# Patient Record
Sex: Female | Born: 1945 | Race: Black or African American | Hispanic: No | Marital: Single | Smoking: Never smoker
Health system: Southern US, Community
[De-identification: ages and names within clinical notes are randomized; demographics above are authoritative.]

## PROBLEM LIST (undated history)

## (undated) DIAGNOSIS — I639 Cerebral infarction, unspecified: Secondary | ICD-10-CM

## (undated) DIAGNOSIS — I10 Essential (primary) hypertension: Secondary | ICD-10-CM

## (undated) DIAGNOSIS — E785 Hyperlipidemia, unspecified: Secondary | ICD-10-CM

## (undated) HISTORY — DX: Essential (primary) hypertension: I10

## (undated) HISTORY — DX: Hyperlipidemia, unspecified: E78.5

## (undated) HISTORY — DX: Cerebral infarction, unspecified: I63.9

---

## 2011-02-27 DIAGNOSIS — I639 Cerebral infarction, unspecified: Secondary | ICD-10-CM

## 2011-02-27 HISTORY — DX: Cerebral infarction, unspecified: I63.9

## 2016-12-07 ENCOUNTER — Ambulatory Visit (HOSPITAL_COMMUNITY)
Admission: EM | Admit: 2016-12-07 | Discharge: 2016-12-07 | Discharge status: Absent without leave | Payer: Medicare HMO | Attending: Family Medicine | Admitting: Family Medicine

## 2017-10-25 ENCOUNTER — Ambulatory Visit: Payer: Medicare HMO | Admitting: Family Medicine

## 2017-11-11 ENCOUNTER — Encounter: Payer: Self-pay | Admitting: Family Medicine

## 2017-11-11 ENCOUNTER — Other Ambulatory Visit: Payer: Self-pay | Admitting: Family Medicine

## 2017-11-11 ENCOUNTER — Ambulatory Visit (INDEPENDENT_AMBULATORY_CARE_PROVIDER_SITE_OTHER): Payer: Medicare HMO | Admitting: Family Medicine

## 2017-11-11 ENCOUNTER — Other Ambulatory Visit: Payer: Self-pay

## 2017-11-11 VITALS — BP 120/68 | HR 71 | Temp 97.5°F | Ht 63.0 in | Wt 207.0 lb

## 2017-11-11 DIAGNOSIS — R2689 Other abnormalities of gait and mobility: Secondary | ICD-10-CM

## 2017-11-11 DIAGNOSIS — Z0000 Encounter for general adult medical examination without abnormal findings: Secondary | ICD-10-CM | POA: Diagnosis not present

## 2017-11-11 DIAGNOSIS — I10 Essential (primary) hypertension: Secondary | ICD-10-CM | POA: Diagnosis not present

## 2017-11-11 DIAGNOSIS — E559 Vitamin D deficiency, unspecified: Secondary | ICD-10-CM

## 2017-11-11 DIAGNOSIS — R6889 Other general symptoms and signs: Secondary | ICD-10-CM | POA: Diagnosis not present

## 2017-11-11 DIAGNOSIS — D509 Iron deficiency anemia, unspecified: Secondary | ICD-10-CM | POA: Diagnosis not present

## 2017-11-11 DIAGNOSIS — Z23 Encounter for immunization: Secondary | ICD-10-CM

## 2017-11-11 DIAGNOSIS — E7800 Pure hypercholesterolemia, unspecified: Secondary | ICD-10-CM | POA: Insufficient documentation

## 2017-11-11 MED ORDER — AMLODIPINE BESYLATE 10 MG PO TABS: 10.0000 mg | ORAL_TABLET | Freq: Every day | ORAL | 0 refills | Status: DC

## 2017-11-11 MED ORDER — CLONIDINE HCL 0.1 MG PO TABS: 0.1000 mg | ORAL_TABLET | Freq: Two times a day (BID) | ORAL | 3 refills | Status: DC

## 2017-11-11 MED ORDER — ASPIRIN EC 325 MG PO TBEC: 325.0000 mg | DELAYED_RELEASE_TABLET | Freq: Every day | ORAL | 0 refills | Status: DC

## 2017-11-11 MED ORDER — CLONIDINE HCL 0.1 MG PO TABS: 0.1000 mg | ORAL_TABLET | Freq: Two times a day (BID) | ORAL | 0 refills | Status: DC

## 2017-11-11 MED ORDER — ASPIRIN EC 325 MG PO TBEC: 325.0000 mg | DELAYED_RELEASE_TABLET | Freq: Every day | ORAL | 3 refills | Status: DC

## 2017-11-11 MED ORDER — SIMVASTATIN 20 MG PO TABS: 20.0000 mg | ORAL_TABLET | Freq: Every day | ORAL | 3 refills | Status: DC

## 2017-11-11 MED ORDER — ENALAPRIL MALEATE 10 MG PO TABS: 10.0000 mg | ORAL_TABLET | Freq: Every day | ORAL | 3 refills | Status: DC

## 2017-11-11 MED ORDER — AMLODIPINE BESYLATE 10 MG PO TABS: 10.0000 mg | ORAL_TABLET | Freq: Every day | ORAL | 3 refills | Status: DC

## 2017-11-11 MED ORDER — ENALAPRIL MALEATE 10 MG PO TABS: 10.0000 mg | ORAL_TABLET | Freq: Every day | ORAL | 0 refills | Status: DC

## 2017-11-11 NOTE — Assessment & Plan Note (Signed)
Will obtain lipid panel today.  Refilled patient's simvastatin.

## 2017-11-11 NOTE — Assessment & Plan Note (Signed)
Will check TSH today 

## 2017-11-11 NOTE — Assessment & Plan Note (Signed)
Patient is taking iron, but she does not know whether she has iron deficiency anemia or why she would have a iron deficiency anemia.  We will obtain a CBC and ferritin level today and stop iron if it is not necessary.

## 2017-11-11 NOTE — Assessment & Plan Note (Signed)
Will obtain vitamin-D level today

## 2017-11-11 NOTE — Assessment & Plan Note (Signed)
Discussed importance of mammogram and colonoscopy.  Patient would like to continue discussing this at her next visit.

## 2017-11-11 NOTE — Progress Notes (Signed)
Subjective:    Wanda Vaughn - 72 y.o. female MRN [id number]  Date of birth: [birth year]  HPI  Wanda Vaughn is here to establish care.  She would also like to discuss her balance issues and is concerned that one of the glands in her neck is swollen.  Balance issues: Her balance has been reduced since her stroke in 2013.  She walks with a cane for some of the time, although she finds the cane cumbersome at times.  She feels like her fear of falling also contributes to her hesitation while walking.  She often feels woozy when going from sitting to standing.  She denies any falls.  Swollen glands in neck: Says that the glands at the base of her anterior neck are sometimes swollen but are not swollen currently.  She endorses some cold intolerance but denies weight changes or hair/skin changes.   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  No cigarette smoking or alcohol use. Lives with daughter and grandson.   Health Maintenance:  Is due for mammogram and colonoscopy, but would like to discuss these at a later date.  Would like flu shot today. Health Maintenance Due  Topic Date Due  . Hepatitis C Screening  1945/12/10  . TETANUS/TDAP  11/24/1964  . MAMMOGRAM  11/25/1995  . COLONOSCOPY  11/25/1995  . DEXA SCAN  11/25/2010  . PNA vac Low Risk Adult (1 of 2 - PCV13) 11/25/2010    -  reports that she has never smoked. She has never used smokeless tobacco. - Review of Systems: Per HPI. - Past Medical History: Patient Active Problem List   Diagnosis Date Noted  . Pure hypercholesterolemia 11/11/2017  . Cold intolerance  11/11/2017  . Iron deficiency anemia 11/11/2017  . Vitamin D deficiency 11/11/2017  . Essential hypertension 11/11/2017  . Impairment of balance 11/11/2017  . Healthcare maintenance 11/11/2017   - Medications: reviewed and updated   Objective:   Physical Exam BP 120/68   Pulse 71   Temp (!) 97.5 F (36.4 C) (Oral)   Ht 5\' 3"  (1.6 m)   Wt 207 lb (93.9 kg)   SpO2 99%   BMI 36.67 kg/m  Gen: NAD, alert, cooperative with exam, well-appearing, pleasant, obese HEENT: NCAT, PERRL, clear conjunctiva, oropharynx clear, supple neck, no thyromegaly CV: RRR, good S1/S2, no murmur, 1+ pitting edema bilaterally  Resp: CTABL, no wheezes, non-labored Abd: SNTND, BS present, no guarding or organomegaly Skin: no rashes, normal turgor  Neuro: no gross deficits; takes small steps while walking and ambulates slowly Psych: good insight, alert and oriented        Assessment & Plan:   Cold intolerance Will check TSH today.  Impairment of balance Likely sequela of 2013 stroke.  Does not appear to be due to vertigo.  May also be due in part to orthostatic hypotension given patient's worsening symptoms when going from sitting to standing and regimen of 3 blood pressure medicines.  Advised patient to slowly rise from a seated position to reduce orthostatic symptoms.  May benefit from decreasing blood pressure regimen in the future, although we will decide  if those medications need changing at her next visit.  Essential hypertension Will refill patient's amlodipine, enalapril, and clonidine today.  Warned patient that she can have rebound hypertension with stopping clonidine cold [place], so she will need to notify us ahead of time to refill this medication.  Patient's blood pressure was normal today even with being out of enalapril and amlodipine for the past week.  We may be able to increase or stop her clonidine depending on her blood pressure at her next visit.  Will obtain BMP today.  Pure  hypercholesterolemia Will obtain lipid panel today.  Refilled patient's simvastatin.  Iron deficiency anemia Patient is taking iron, but she does not know whether she has iron deficiency anemia or why she would have a iron deficiency anemia.  We will obtain a CBC and ferritin level today and stop iron if it is not necessary.  Vitamin D deficiency Will obtain vitamin D level today.  Healthcare maintenance Discussed importance of mammogram and colonoscopy.  Patient would like to continue discussing this at her next visit.    Maia Breslow, M.D. 11/11/2017, 9:31 AM PGY-2, [place]

## 2017-11-11 NOTE — Assessment & Plan Note (Signed)
Will refill patient's amlodipine, enalapril, and clonidine today.  Warned patient that she can have rebound hypertension with stopping clonidine cold [place], so she will need to notify us ahead of time to refill this medication.  Patient's blood pressure was normal today even with being out of enalapril and amlodipine for the past week.  We may be able to increase or stop her clonidine depending on her blood pressure at her next visit.  Will obtain BMP today.

## 2017-11-11 NOTE — Patient Instructions (Addendum)
It was nice meeting you today Wanda Vaughn!  Today, we will get several labs, and I will contact you in the next few days with the results.  I am refilling your medications and sending them to the [facility] on [address].  I will also send them to the [facility] mail order pharmacy.  Please change how you're taking enalapril from twice daily to once daily.  I would like to see you back in two months.  If you have any questions or concerns, please feel free to call the clinic.   Be well,  Dr. Shan Levans

## 2017-11-11 NOTE — Addendum Note (Signed)
Addended by: Kathrene Alu on: 11/11/2017 09:38 AM   Modules accepted: Orders

## 2017-11-11 NOTE — Assessment & Plan Note (Addendum)
Likely sequela of 2013 stroke.  Does not appear to be due to vertigo.  May also be due in part to orthostatic hypotension given patient's worsening symptoms when going from sitting to standing and regimen of 3 blood pressure medicines.  Advised patient to slowly rise from a seated position to reduce orthostatic symptoms.  May benefit from decreasing blood pressure regimen in the future, although we will decide if those medications need changing at her next visit.

## 2017-11-12 ENCOUNTER — Telehealth: Payer: Self-pay | Admitting: Family Medicine

## 2017-11-12 LAB — CBC
HEMATOCRIT: 34.2 % (ref 34.0–46.6)
HEMOGLOBIN: 10.9 g/dL — AB (ref 11.1–15.9)
MCH: 28.9 pg (ref 26.6–33.0)
MCHC: 31.9 g/dL (ref 31.5–35.7)
MCV: 91 fL (ref 79–97)
PLATELETS: 248 10*3/uL (ref 150–450)
RBC: 3.77 x10E6/uL (ref 3.77–5.28)
RDW: 16.2 % — ABNORMAL HIGH (ref 12.3–15.4)
WBC: 7.1 10*3/uL (ref 3.4–10.8)

## 2017-11-12 LAB — BASIC METABOLIC PANEL
BUN/Creatinine Ratio: 16 (ref 12–28)
BUN: 16 mg/dL (ref 8–27)
CALCIUM: 9.9 mg/dL (ref 8.7–10.3)
CO2: 22 mmol/L (ref 20–29)
Chloride: 110 mmol/L — ABNORMAL HIGH (ref 96–106)
Creatinine, Ser: 1.02 mg/dL — ABNORMAL HIGH (ref 0.57–1.00)
GFR calc Af Amer: 64 mL/min/{1.73_m2} (ref >59)
GFR, EST NON AFRICAN AMERICAN: 55 mL/min/{1.73_m2} — AB (ref >59)
GLUCOSE: 92 mg/dL (ref 65–99)
Potassium: 4.5 mmol/L (ref 3.5–5.2)
SODIUM: 143 mmol/L (ref 134–144)

## 2017-11-12 LAB — LIPID PANEL
CHOLESTEROL TOTAL: 161 mg/dL (ref 100–199)
Chol/HDL Ratio: 2.7 ratio (ref 0.0–4.4)
HDL: 60 mg/dL (ref >39)
LDL Calculated: 88 mg/dL (ref 0–99)
Triglycerides: 65 mg/dL (ref 0–149)
VLDL CHOLESTEROL CAL: 13 mg/dL (ref 5–40)

## 2017-11-12 LAB — FERRITIN: Ferritin: 113 ng/mL (ref 15–150)

## 2017-11-12 LAB — VITAMIN D 25 HYDROXY (VIT D DEFICIENCY, FRACTURES): Vit D, 25-Hydroxy: 25.7 ng/mL — ABNORMAL LOW (ref 30.0–100.0)

## 2017-11-12 LAB — TSH: TSH: 1.59 u[IU]/mL (ref 0.450–4.500)

## 2017-11-12 NOTE — Telephone Encounter (Signed)
Called patient to inform her that her TSH, cholesterol, CBC, BMP, and ferritin levels were normal.  Even though her Vitamin D is low, I told her that there is no good evidence that taking a vitamin D supplement would be helpful for her, so she can stop taking her vitamin D.  Also, since her ferritin level was normal, she can stop taking iron.  She was in agreement with this plan.

## 2017-12-23 ENCOUNTER — Ambulatory Visit: Payer: Medicare HMO | Admitting: Family Medicine

## 2018-01-31 ENCOUNTER — Other Ambulatory Visit: Payer: Self-pay

## 2018-01-31 ENCOUNTER — Encounter: Payer: Self-pay | Admitting: Family Medicine

## 2018-01-31 ENCOUNTER — Ambulatory Visit (INDEPENDENT_AMBULATORY_CARE_PROVIDER_SITE_OTHER): Payer: Medicare HMO | Admitting: Family Medicine

## 2018-01-31 VITALS — BP 158/76 | HR 93 | Temp 98.8°F | Ht 63.0 in | Wt 208.4 lb

## 2018-01-31 DIAGNOSIS — I10 Essential (primary) hypertension: Secondary | ICD-10-CM

## 2018-01-31 DIAGNOSIS — R2689 Other abnormalities of gait and mobility: Secondary | ICD-10-CM | POA: Diagnosis not present

## 2018-01-31 DIAGNOSIS — Z0000 Encounter for general adult medical examination without abnormal findings: Secondary | ICD-10-CM

## 2018-01-31 NOTE — Assessment & Plan Note (Addendum)
Unsure what the cause of this is.  Could be a sequelae of her 2013 stroke, but her symptoms started 2 years after her stroke occurred.  It is possible that she has microvascular disease causing this symptom as well.  Also possible to be BPPV.  Does not seem to be in eyesight issue because she was seen by her eye doctor 1 year ago without any problems noted.  Dix-Hallpike maneuver was negative.  Orthostatic vital signs are negative.  will refer to physical therapy to see if they can give her modifications to improve her balance and increase her strength and endurance.

## 2018-01-31 NOTE — Assessment & Plan Note (Signed)
Patient's blood pressure was elevated today but she says that she did not take her morning medications because she was in a rush to get to her appointment.  Her blood pressure usually runs in the 120s/60s-70s, so we will continue her current regimen.

## 2018-01-31 NOTE — Assessment & Plan Note (Signed)
Order was placed for a screening mammogram.  Patient will call to schedule her mammogram when it is convenient for her.  She is unsure about what colon cancer screening she should do.  I explained the different options and said that they are all optional for her.  She will continue to consider them.

## 2018-01-31 NOTE — Patient Instructions (Addendum)
It was nice seeing you today Ms. Popp!  Your labs looked great!  Keep up the good work.  For your imbalance, I am referring you to physical therapy to see if they can help with this issue.  You should get a call about scheduling this in a few weeks.  I am placing the order for your mammogram - please call the mammogram office when you're ready to schedule it.  Keep thinking about whether you would want a colonoscopy or the cologuard cards.  We will mail your SCAT application to you.  I'd like to see you again in six months, or sooner if you need.  If you have any questions or concerns, please feel free to call the clinic.   Be well,  Dr. Shan Levans

## 2018-01-31 NOTE — Progress Notes (Signed)
Subjective:    Wanda Vaughn - 72 y.o. female MRN [id number]  Date of birth: [birth year]  CC SCAT application, walking difficulties  HPI  Wanda Vaughn is here to follow up on the above issues.  Imbalance while walking - she thinks it may be due to her eyesight, but she says it is hard to describe - has not fallen since her symptoms started four years ago - does not feel like dizziness, lightheadedness, or vertigo - had a stroke in 2013 but symptoms started around 2015  SCAT  - has difficulties climbing and walking long distances  - often uses a cane during ambulation  Health Maintenance:  - will schedule a mammogram when she has her SCAT arranged - is considering colonoscopy vs cologuard Health Maintenance Due  Topic Date Due  . Hepatitis C Screening  1945/07/15  . TETANUS/TDAP  11/24/1964  . MAMMOGRAM  11/25/1995  . COLONOSCOPY  11/25/1995  . DEXA SCAN  11/25/2010  . PNA vac Low Risk Adult (1 of 2 - PCV13) 11/25/2010    -  reports that she has never smoked. She has never used smokeless tobacco. - Review of Systems: Per HPI. - Past Medical History: Patient Active Problem List   Diagnosis Date Noted  . Pure hypercholesterolemia 11/11/2017  . Cold intolerance 11/11/2017  . Iron deficiency anemia 11/11/2017  . Vitamin D deficiency 11/11/2017  . Essential hypertension 11/11/2017  . Impairment of balance 11/11/2017  . Healthcare maintenance 11/11/2017   - Medications: reviewed and updated   Objective:   Physical Exam BP (!) 158/76   Pulse 93   Temp 98.8 F (37.1 C) (Oral)   Ht 5\' 3"  (1.6 m)   Wt 208 lb 6.4 oz (94.5 kg)   SpO2 97%   BMI 36.92 kg/m  Gen: NAD, alert, cooperative with exam, well-appearing, pleasant CV: RRR, good S1/S2, no murmur Resp: CTABL, no wheezes, non-labored Neuro: no gross deficits.  Psych: good insight, alert and oriented   Orthostatic vital signs:  Orthostatic VS for the past 24 hrs:  BP- Sitting Pulse- Sitting BP- Standing at  0 minutes Pulse- Standing at 0 minutes  01/31/18 0900 158/80 83 162/86 95         Assessment & Plan:   Impairment of balance Unsure what the cause of this is.  Could be a sequelae of her 2013 stroke, but her symptoms started 2 years after her stroke occurred.  It is possible that she has microvascular disease causing this symptom as well.  Also possible to be BPPV.  Does not seem to be in eyesight issue because she was seen by her eye doctor 1 year ago without any problems noted.  Dix-Hallpike maneuver was negative.  Orthostatic vital signs are negative.  will refer to physical therapy to see if they can give her modifications to improve her balance and increase her strength and endurance.  Essential hypertension Patient's blood pressure was elevated today but she says that she did not take her morning medications because she was in a rush to get to her appointment.  Her blood pressure usually runs in the 120s/60s-70s, so we will continue her current regimen.  Healthcare maintenance Order was placed for a screening mammogram.  Patient will call to schedule her mammogram when it is convenient for her.  She is unsure about what colon cancer screening she should do.  I explained the different options and said that they are all optional for her.  She will continue to  consider them.    Maia Breslow, M.D. 01/31/2018, 10:46 AM PGY-2, [place]

## 2018-02-07 ENCOUNTER — Other Ambulatory Visit: Payer: Self-pay | Admitting: Family Medicine

## 2018-02-21 ENCOUNTER — Telehealth: Payer: Self-pay | Admitting: Family Medicine

## 2018-02-21 NOTE — Telephone Encounter (Signed)
Patient dropped off form to be completed for Dr Shan Levans.  She would like it mailed to her home after filled out and signed.

## 2018-03-05 NOTE — Telephone Encounter (Signed)
Form was completed and mailed at the end of December.  Thanks!

## 2018-08-21 ENCOUNTER — Other Ambulatory Visit: Payer: Self-pay | Admitting: Family Medicine

## 2018-09-02 ENCOUNTER — Encounter: Payer: Self-pay | Admitting: Family Medicine

## 2018-09-02 ENCOUNTER — Ambulatory Visit (INDEPENDENT_AMBULATORY_CARE_PROVIDER_SITE_OTHER): Payer: Medicare HMO | Admitting: Family Medicine

## 2018-09-02 ENCOUNTER — Other Ambulatory Visit: Payer: Self-pay

## 2018-09-02 VITALS — BP 162/80 | HR 73 | Wt 211.2 lb

## 2018-09-02 DIAGNOSIS — Z23 Encounter for immunization: Secondary | ICD-10-CM | POA: Diagnosis not present

## 2018-09-02 DIAGNOSIS — R2689 Other abnormalities of gait and mobility: Secondary | ICD-10-CM

## 2018-09-02 DIAGNOSIS — Z1211 Encounter for screening for malignant neoplasm of colon: Secondary | ICD-10-CM

## 2018-09-02 DIAGNOSIS — I10 Essential (primary) hypertension: Secondary | ICD-10-CM

## 2018-09-02 DIAGNOSIS — E2839 Other primary ovarian failure: Secondary | ICD-10-CM | POA: Diagnosis not present

## 2018-09-02 MED ORDER — ENALAPRIL MALEATE 20 MG PO TABS: 20.0000 mg | ORAL_TABLET | Freq: Every day | ORAL | 3 refills | Status: DC

## 2018-09-02 MED ORDER — ASPIRIN 81 MG PO TBEC: 81.0000 mg | DELAYED_RELEASE_TABLET | Freq: Every day | ORAL | 0 refills | Status: DC

## 2018-09-02 NOTE — Assessment & Plan Note (Signed)
Patient's blood pressure was greater than 160/80 on two measurements.  We will wean off of her clonidine.  Patient was instructed to take this every other night for the next week and then stop.  We will increase her dose of enalapril to 20 mg nightly and continue her amlodipine.  She should come in in 1 month for a nurse visit to recheck her blood pressure.

## 2018-09-02 NOTE — Assessment & Plan Note (Signed)
Home health PT was ordered today since patient may benefit from training to increase her sense of balance while ambulating.  This does not seem to be vertigo or orthostatic hypotension due to patient's description detailed above.

## 2018-09-02 NOTE — Patient Instructions (Signed)
It was nice seeing you today Wanda Vaughn!  For your blood pressure, please take the clonidine every other night for a week and then stop it.  We are increasing your enalapril to 20 mg, so when the new prescription arrives, you can continue taking 1 pill per night.  Until then, you can take 2 pills of your current prescription.  I would like for you to return in about 1 month for [place] to recheck your blood pressure.  You can make an nurse appointment which will be quicker than seeing me.  I am prescribing a mammogram, a bone density scan, and Cologuard.  Please call [facility] to schedule your mammogram and bone density scan.  You will receive a call from the [facility] telling you how to do this screen.  I am also ordering physical therapy to help with your balance.  Please let me know if you have not heard from anyone in the next 2 weeks.  If you have any questions or concerns, please feel free to call the clinic.   Be well,  Dr. Shan Levans

## 2018-09-02 NOTE — Progress Notes (Signed)
   Subjective:    Wanda Vaughn - 73 y.o. female MRN [id number]  Date of birth: 1946-02-02  CC:  NOELA BROTHERS is here for follow up of imbalance with ambulation and health maintenance.  HPI: Impairment of balance Patient says that she will feel a sense of imbalance when ambulating.  She has a cane but tries not to use it.  She does not feel like the room is spinning and does not feel any nausea.  She also does not feel lightheaded, and this does not occur when going from sitting to standing.  This has been an ongoing issue for over a year.  She does have a history of a CVA in 2013 and is not sure if this is related.  She notes that she went to get some pants tailored and the tailor told her that one leg was significantly shorter than the other.  Hypertension medications Patient reports that she has stopped taking her clonidine during the day even though she it supposed to be a twice daily medication.  She now only takes clonidine 0.1 mg nightly along with her amlodipine and enalapril for blood pressure.  She stopped taking the clonidine during the day because it made her feel more fatigued.  Health Maintenance:  PCV 13 was given, and mammogram, Cologuard, and DEXA scan were ordered. Health Maintenance Due  Topic Date Due  . Hepatitis C Screening  1945/09/24  . TETANUS/TDAP  11/24/1964  . MAMMOGRAM  11/25/1995  . DEXA SCAN  11/25/2010    -  reports that she has never smoked. She has never used smokeless tobacco. - Review of Systems: Per HPI. - Past Medical History: Patient Active Problem List   Diagnosis Date Noted  . Pure hypercholesterolemia 11/11/2017  . Cold intolerance 11/11/2017  . Iron deficiency anemia 11/11/2017  . Vitamin D deficiency 11/11/2017  . Essential hypertension 11/11/2017  . Impairment of balance 11/11/2017  . Healthcare maintenance 11/11/2017   - Medications: reviewed and updated   Objective:   Physical Exam BP (!) 162/80   Pulse 73   Wt 211 lb 3.2 oz  (95.8 kg)   SpO2 99%   BMI 37.41 kg/m  Gen: NAD, alert, cooperative with exam, well-appearing, pleasant HEENT: NCAT, clear conjunctiva, supple neck CV: RRR, good S1/S2, no murmur, no edema Resp: CTABL, no wheezes, non-labored Abd: SNTND, BS present, no guarding or organomegaly Skin: no rashes, normal turgor  Psych: good insight, alert and oriented Musculoskeletal: Normal gait, no Trendelenburg sign, no leg length disparity on gross inspection      Assessment & Plan:   Impairment of balance Home health PT was ordered today since patient may benefit from training to increase her sense of balance while ambulating.  This does not seem to be vertigo or orthostatic hypotension due to patient's description detailed above.  Essential hypertension Patient's blood pressure was greater than 160/80 on two measurements.  We will wean off of her clonidine.  Patient was instructed to take this every other night for the next week and then stop.  We will increase her dose of enalapril to 20 mg nightly and continue her amlodipine.  She should come in in 1 month for a nurse visit to recheck her blood pressure.    Maia Breslow, M.D. 09/02/2018, 1:46 PM PGY-3, [place]

## 2018-09-10 DIAGNOSIS — Z8673 Personal history of transient ischemic attack (TIA), and cerebral infarction without residual deficits: Secondary | ICD-10-CM | POA: Diagnosis not present

## 2018-09-10 DIAGNOSIS — E559 Vitamin D deficiency, unspecified: Secondary | ICD-10-CM | POA: Diagnosis not present

## 2018-09-10 DIAGNOSIS — Z9181 History of falling: Secondary | ICD-10-CM | POA: Diagnosis not present

## 2018-09-10 DIAGNOSIS — Z7982 Long term (current) use of aspirin: Secondary | ICD-10-CM | POA: Diagnosis not present

## 2018-09-10 DIAGNOSIS — I10 Essential (primary) hypertension: Secondary | ICD-10-CM | POA: Diagnosis not present

## 2018-09-10 DIAGNOSIS — D509 Iron deficiency anemia, unspecified: Secondary | ICD-10-CM | POA: Diagnosis not present

## 2018-09-10 DIAGNOSIS — I951 Orthostatic hypotension: Secondary | ICD-10-CM | POA: Diagnosis not present

## 2018-09-10 DIAGNOSIS — E7800 Pure hypercholesterolemia, unspecified: Secondary | ICD-10-CM | POA: Diagnosis not present

## 2018-09-22 DIAGNOSIS — Z7982 Long term (current) use of aspirin: Secondary | ICD-10-CM | POA: Diagnosis not present

## 2018-09-22 DIAGNOSIS — I951 Orthostatic hypotension: Secondary | ICD-10-CM | POA: Diagnosis not present

## 2018-09-22 DIAGNOSIS — E559 Vitamin D deficiency, unspecified: Secondary | ICD-10-CM | POA: Diagnosis not present

## 2018-09-22 DIAGNOSIS — I10 Essential (primary) hypertension: Secondary | ICD-10-CM | POA: Diagnosis not present

## 2018-09-22 DIAGNOSIS — Z9181 History of falling: Secondary | ICD-10-CM | POA: Diagnosis not present

## 2018-09-22 DIAGNOSIS — Z8673 Personal history of transient ischemic attack (TIA), and cerebral infarction without residual deficits: Secondary | ICD-10-CM | POA: Diagnosis not present

## 2018-09-22 DIAGNOSIS — E7800 Pure hypercholesterolemia, unspecified: Secondary | ICD-10-CM | POA: Diagnosis not present

## 2018-09-22 DIAGNOSIS — D509 Iron deficiency anemia, unspecified: Secondary | ICD-10-CM | POA: Diagnosis not present

## 2018-09-25 ENCOUNTER — Telehealth: Payer: Self-pay | Admitting: Family Medicine

## 2018-09-25 DIAGNOSIS — Z9181 History of falling: Secondary | ICD-10-CM | POA: Diagnosis not present

## 2018-09-25 DIAGNOSIS — D509 Iron deficiency anemia, unspecified: Secondary | ICD-10-CM | POA: Diagnosis not present

## 2018-09-25 DIAGNOSIS — E559 Vitamin D deficiency, unspecified: Secondary | ICD-10-CM | POA: Diagnosis not present

## 2018-09-25 DIAGNOSIS — Z7982 Long term (current) use of aspirin: Secondary | ICD-10-CM | POA: Diagnosis not present

## 2018-09-25 DIAGNOSIS — Z8673 Personal history of transient ischemic attack (TIA), and cerebral infarction without residual deficits: Secondary | ICD-10-CM | POA: Diagnosis not present

## 2018-09-25 DIAGNOSIS — E7800 Pure hypercholesterolemia, unspecified: Secondary | ICD-10-CM | POA: Diagnosis not present

## 2018-09-25 DIAGNOSIS — I951 Orthostatic hypotension: Secondary | ICD-10-CM | POA: Diagnosis not present

## 2018-09-25 DIAGNOSIS — I10 Essential (primary) hypertension: Secondary | ICD-10-CM | POA: Diagnosis not present

## 2018-09-25 NOTE — Telephone Encounter (Signed)
Samyra from [facility] called to inform that pt's vitals were 150/90, and they increased to 170-100 in both arms. Please give her a call back.

## 2018-09-30 ENCOUNTER — Other Ambulatory Visit: Payer: Self-pay | Admitting: Family Medicine

## 2018-09-30 ENCOUNTER — Telehealth: Payer: Self-pay | Admitting: Family Medicine

## 2018-09-30 MED ORDER — CHLORTHALIDONE 25 MG PO TABS: 25.0000 mg | ORAL_TABLET | Freq: Every day | ORAL | 3 refills | Status: DC

## 2018-09-30 NOTE — Telephone Encounter (Signed)
Called patient and discussed with her the option of starting either carvedilol or chlorthalidone for her blood pressure control.  Described each medicine and said that carvedilol will need to be taken twice daily while chlorthalidone may make her urinate more but can be taken once daily.  She would like to try chlorthalidone first.  I will send in chlorthalidone 25 mg to her mail order pharmacy.  We will need to check her BMP at her next visit.  She will have her blood pressure checked again tomorrow and let me know if she is concerned.  She expressed understanding with this plan.

## 2018-09-30 NOTE — Telephone Encounter (Signed)
Left voicemail letting her know that I received her message regarding Ms. Gong' blood pressure readings.  We will likely make further changes to her blood pressure medication regimen.

## 2018-10-01 DIAGNOSIS — I10 Essential (primary) hypertension: Secondary | ICD-10-CM | POA: Diagnosis not present

## 2018-10-01 DIAGNOSIS — Z9181 History of falling: Secondary | ICD-10-CM | POA: Diagnosis not present

## 2018-10-01 DIAGNOSIS — E7800 Pure hypercholesterolemia, unspecified: Secondary | ICD-10-CM | POA: Diagnosis not present

## 2018-10-01 DIAGNOSIS — E559 Vitamin D deficiency, unspecified: Secondary | ICD-10-CM | POA: Diagnosis not present

## 2018-10-01 DIAGNOSIS — Z7982 Long term (current) use of aspirin: Secondary | ICD-10-CM | POA: Diagnosis not present

## 2018-10-01 DIAGNOSIS — I951 Orthostatic hypotension: Secondary | ICD-10-CM | POA: Diagnosis not present

## 2018-10-01 DIAGNOSIS — Z8673 Personal history of transient ischemic attack (TIA), and cerebral infarction without residual deficits: Secondary | ICD-10-CM | POA: Diagnosis not present

## 2018-10-01 DIAGNOSIS — D509 Iron deficiency anemia, unspecified: Secondary | ICD-10-CM | POA: Diagnosis not present

## 2018-10-03 DIAGNOSIS — Z7982 Long term (current) use of aspirin: Secondary | ICD-10-CM | POA: Diagnosis not present

## 2018-10-03 DIAGNOSIS — I10 Essential (primary) hypertension: Secondary | ICD-10-CM | POA: Diagnosis not present

## 2018-10-03 DIAGNOSIS — Z9181 History of falling: Secondary | ICD-10-CM | POA: Diagnosis not present

## 2018-10-03 DIAGNOSIS — Z8673 Personal history of transient ischemic attack (TIA), and cerebral infarction without residual deficits: Secondary | ICD-10-CM | POA: Diagnosis not present

## 2018-10-03 DIAGNOSIS — E7800 Pure hypercholesterolemia, unspecified: Secondary | ICD-10-CM | POA: Diagnosis not present

## 2018-10-03 DIAGNOSIS — D509 Iron deficiency anemia, unspecified: Secondary | ICD-10-CM | POA: Diagnosis not present

## 2018-10-03 DIAGNOSIS — E559 Vitamin D deficiency, unspecified: Secondary | ICD-10-CM | POA: Diagnosis not present

## 2018-10-03 DIAGNOSIS — I951 Orthostatic hypotension: Secondary | ICD-10-CM | POA: Diagnosis not present

## 2018-10-09 DIAGNOSIS — D509 Iron deficiency anemia, unspecified: Secondary | ICD-10-CM | POA: Diagnosis not present

## 2018-10-09 DIAGNOSIS — Z9181 History of falling: Secondary | ICD-10-CM | POA: Diagnosis not present

## 2018-10-09 DIAGNOSIS — I10 Essential (primary) hypertension: Secondary | ICD-10-CM | POA: Diagnosis not present

## 2018-10-09 DIAGNOSIS — Z8673 Personal history of transient ischemic attack (TIA), and cerebral infarction without residual deficits: Secondary | ICD-10-CM | POA: Diagnosis not present

## 2018-10-09 DIAGNOSIS — E559 Vitamin D deficiency, unspecified: Secondary | ICD-10-CM | POA: Diagnosis not present

## 2018-10-09 DIAGNOSIS — E7800 Pure hypercholesterolemia, unspecified: Secondary | ICD-10-CM | POA: Diagnosis not present

## 2018-10-09 DIAGNOSIS — I951 Orthostatic hypotension: Secondary | ICD-10-CM | POA: Diagnosis not present

## 2018-10-09 DIAGNOSIS — Z7982 Long term (current) use of aspirin: Secondary | ICD-10-CM | POA: Diagnosis not present

## 2018-10-17 DIAGNOSIS — I951 Orthostatic hypotension: Secondary | ICD-10-CM | POA: Diagnosis not present

## 2018-10-17 DIAGNOSIS — E559 Vitamin D deficiency, unspecified: Secondary | ICD-10-CM | POA: Diagnosis not present

## 2018-10-17 DIAGNOSIS — Z9181 History of falling: Secondary | ICD-10-CM | POA: Diagnosis not present

## 2018-10-17 DIAGNOSIS — Z8673 Personal history of transient ischemic attack (TIA), and cerebral infarction without residual deficits: Secondary | ICD-10-CM | POA: Diagnosis not present

## 2018-10-17 DIAGNOSIS — I10 Essential (primary) hypertension: Secondary | ICD-10-CM | POA: Diagnosis not present

## 2018-10-17 DIAGNOSIS — D509 Iron deficiency anemia, unspecified: Secondary | ICD-10-CM | POA: Diagnosis not present

## 2018-10-17 DIAGNOSIS — E7800 Pure hypercholesterolemia, unspecified: Secondary | ICD-10-CM | POA: Diagnosis not present

## 2018-10-17 DIAGNOSIS — Z7982 Long term (current) use of aspirin: Secondary | ICD-10-CM | POA: Diagnosis not present

## 2018-10-21 DIAGNOSIS — I951 Orthostatic hypotension: Secondary | ICD-10-CM | POA: Diagnosis not present

## 2018-10-21 DIAGNOSIS — I10 Essential (primary) hypertension: Secondary | ICD-10-CM | POA: Diagnosis not present

## 2018-10-21 DIAGNOSIS — Z9181 History of falling: Secondary | ICD-10-CM | POA: Diagnosis not present

## 2018-10-21 DIAGNOSIS — Z8673 Personal history of transient ischemic attack (TIA), and cerebral infarction without residual deficits: Secondary | ICD-10-CM | POA: Diagnosis not present

## 2018-10-21 DIAGNOSIS — E559 Vitamin D deficiency, unspecified: Secondary | ICD-10-CM | POA: Diagnosis not present

## 2018-10-21 DIAGNOSIS — Z7982 Long term (current) use of aspirin: Secondary | ICD-10-CM | POA: Diagnosis not present

## 2018-10-21 DIAGNOSIS — D509 Iron deficiency anemia, unspecified: Secondary | ICD-10-CM | POA: Diagnosis not present

## 2018-10-21 DIAGNOSIS — E7800 Pure hypercholesterolemia, unspecified: Secondary | ICD-10-CM | POA: Diagnosis not present

## 2018-10-23 DIAGNOSIS — E7800 Pure hypercholesterolemia, unspecified: Secondary | ICD-10-CM | POA: Diagnosis not present

## 2018-10-23 DIAGNOSIS — I10 Essential (primary) hypertension: Secondary | ICD-10-CM | POA: Diagnosis not present

## 2018-10-23 DIAGNOSIS — E559 Vitamin D deficiency, unspecified: Secondary | ICD-10-CM | POA: Diagnosis not present

## 2018-10-23 DIAGNOSIS — Z8673 Personal history of transient ischemic attack (TIA), and cerebral infarction without residual deficits: Secondary | ICD-10-CM | POA: Diagnosis not present

## 2018-10-23 DIAGNOSIS — Z9181 History of falling: Secondary | ICD-10-CM | POA: Diagnosis not present

## 2018-10-23 DIAGNOSIS — I951 Orthostatic hypotension: Secondary | ICD-10-CM | POA: Diagnosis not present

## 2018-10-23 DIAGNOSIS — D509 Iron deficiency anemia, unspecified: Secondary | ICD-10-CM | POA: Diagnosis not present

## 2018-10-23 DIAGNOSIS — Z7982 Long term (current) use of aspirin: Secondary | ICD-10-CM | POA: Diagnosis not present

## 2018-11-13 ENCOUNTER — Other Ambulatory Visit: Payer: Self-pay | Admitting: Family Medicine

## 2018-11-27 ENCOUNTER — Telehealth: Payer: Self-pay

## 2018-11-27 NOTE — Telephone Encounter (Signed)
That is correct, Page, thank you!

## 2018-11-27 NOTE — Telephone Encounter (Signed)
Patient calls nurse line wanting to discuss blood pressure medications. Patient stated she received via mail order, Clonidine, and this has confused her. Patient did say she followed instructions from July and has weaned off Clonidine. I informed her this is correct, throw out the clonidine, or return if possible. Patient stated she is taking Amlodipine 10mg  daily, Chlorthalidone 25mg  daily, and Enalapril 20mg  daily. Will forward to PCP for confirmation, however per notes I informed her this is correct. Patient coming in 10/5 for flu vaccine and I will check her BP then as well.

## 2018-12-01 ENCOUNTER — Other Ambulatory Visit: Payer: Self-pay

## 2018-12-01 ENCOUNTER — Ambulatory Visit (INDEPENDENT_AMBULATORY_CARE_PROVIDER_SITE_OTHER): Payer: Medicare HMO | Admitting: *Deleted

## 2018-12-01 DIAGNOSIS — Z23 Encounter for immunization: Secondary | ICD-10-CM

## 2018-12-06 DIAGNOSIS — H524 Presbyopia: Secondary | ICD-10-CM | POA: Diagnosis not present

## 2018-12-06 DIAGNOSIS — H5203 Hypermetropia, bilateral: Secondary | ICD-10-CM | POA: Diagnosis not present

## 2018-12-06 DIAGNOSIS — H52209 Unspecified astigmatism, unspecified eye: Secondary | ICD-10-CM | POA: Diagnosis not present

## 2019-05-15 ENCOUNTER — Ambulatory Visit: Payer: Medicare HMO | Attending: Internal Medicine

## 2019-05-15 DIAGNOSIS — Z23 Encounter for immunization: Secondary | ICD-10-CM

## 2019-05-15 NOTE — Progress Notes (Signed)
   Covid-19 Vaccination Clinic  Name:  RAHMAH MCCAMY    MRN: [id number] DOB: March 15, 1945  05/15/2019  Ms. Schobert was observed post Covid-19 immunization for 15 minutes without incident. She was provided with Vaccine Information Sheet and instruction to access the V-Safe system.   Ms. Jock was instructed to call 911 with any severe reactions post vaccine: Marland Kitchen Difficulty breathing  . Swelling of face and throat  . A fast heartbeat  . A bad rash all over body  . Dizziness and weakness   Immunizations Administered    Name Date Dose VIS Date Route   Pfizer COVID-19 Vaccine 05/15/2019  9:35 AM 0.3 mL 02/06/2019 Intramuscular   Manufacturer: [place]   Lot: [id number]   [place]: [id number]

## 2019-06-03 ENCOUNTER — Other Ambulatory Visit: Payer: Self-pay | Admitting: Family Medicine

## 2019-06-09 ENCOUNTER — Ambulatory Visit: Payer: Medicare HMO | Attending: Internal Medicine

## 2019-06-09 DIAGNOSIS — Z23 Encounter for immunization: Secondary | ICD-10-CM

## 2019-06-09 NOTE — Progress Notes (Signed)
   Covid-19 Vaccination Clinic  Name:  SHALEAH NISSLEY    MRN: [id number] DOB: 1945-05-08  06/09/2019  Ms. Lazenby was observed post Covid-19 immunization for 15 minutes without incident. She was provided with Vaccine Information Sheet and instruction to access the V-Safe system.   Ms. Matusek was instructed to call 911 with any severe reactions post vaccine: Marland Kitchen Difficulty breathing  . Swelling of face and throat  . A fast heartbeat  . A bad rash all over body  . Dizziness and weakness   Immunizations Administered    Name Date Dose VIS Date Route   Pfizer COVID-19 Vaccine 06/09/2019  4:34 PM 0.3 mL 02/06/2019 Intramuscular   Manufacturer: [place]   Lot: [id number]   [place]: [address]-9

## 2019-06-24 ENCOUNTER — Other Ambulatory Visit: Payer: Self-pay | Admitting: Family Medicine

## 2019-07-14 ENCOUNTER — Telehealth: Payer: Self-pay | Admitting: *Deleted

## 2019-07-14 NOTE — Telephone Encounter (Signed)
-----   Message from [place] Pink, [place] sent at 07/03/2019  9:29 AM EDT ----- Regarding: Cologuard Cologuard due

## 2019-07-14 NOTE — Telephone Encounter (Signed)
Contacted pt to remind her that her cologuard needs to be completed before it expires.Javone Ybanez Zimmerman Rumple, CMA

## 2019-07-16 NOTE — Telephone Encounter (Signed)
Patient returns call. Patient advised to complete Cologuard. Patient has no interest in doing Cologuard at this time.

## 2019-08-19 ENCOUNTER — Other Ambulatory Visit: Payer: Self-pay | Admitting: Family Medicine

## 2020-02-16 NOTE — Progress Notes (Signed)
° ° ° °  SUBJECTIVE:   CHIEF COMPLAINT / HPI:   Wanda Vaughn is a 74 y.o. female presents for covid booster and medication management   Medication management  Patient would like me to clarify the medications that she is taking and what she is taking them for.   Hypertension: Patient's current antihypertensive medications include: Amlodipine 10mg , Chlorthalidone 25mg  and Enalapril 20mg . Endorses fatigue with Chlorthalidone and takes this every other day. Compliant with medications and tolerating well without side effects.  Checks BP at home but does not remember what her Bps usually are.  Denies any SOB, CP, vision changes, LE edema, medication SEs, or symptoms of hypotension. Denies history of smoking.   Most recent creatinine trend:  Lab Results  Component Value Date   CREATININE 1.02 (H) 11/11/2017    Patient has not had a BMP in the past 1 year.   HLD Patient is currently taking simvastatin 20 mg daily.  Endorses compliance and tolerating well without side effects. Denies RUQ pain or myalgias.  History of stroke Patient had a stroke 5 years ago and takes aspirin 81 mg daily  PERTINENT  PMH / PSH: HTN, HLD, Vitamin D deficiency  OBJECTIVE:   BP 138/68    Pulse 95    Ht 5\' 3"  (1.6 m)    Wt 201 lb 6.4 oz (91.4 kg)    SpO2 100%    BMI 35.68 kg/m    General: Alert, no acute distress, pleasant, frail-appearing Cardio: Normal S1 and S2, RRR, no r/m/g Pulm: CTAB, normal work of breathing Abdomen: Bowel sounds normal. Abdomen soft and non-tender.  Extremities: No peripheral edema.  Neuro: Cranial nerves grossly intact   ASSESSMENT/PLAN:   COVID-19 vaccine administered Covid vaccine administered today.   Influenza vaccine administered Flu vaccine administered today.   Essential hypertension Blood pressure at goal, however patient is likely experiencing fatigue with Chlorthalidone, this was the most recently added BP. I suspect she is getting hypotensive at times due to 3  antihypertensives. Will reduce amlodipine to 5 mg and see if this helps her.  If she continues to feel tired with Chlorthalidone then it is probably a side effect of Chlorthalidone and we can stop this medication at next blood pressure check in 2 weeks. Recommended BP diary at home. Obtained CMP today.   Pure hypercholesterolemia Obtain lipid panel today.  Will consider increasing simvastatin dose if necessary.  Diabetes mellitus screening No previous A1c on file.  Obtained A1c today.   Numbness and tingling of both feet Patient endorses numbness and tingling in both feet, mostly over the toes. Normal sensation today. Obtain CMP, TSH, B12 and A1c to investigate potential causes of neuropathy.     Lattie Haw, MD PGY-2 [place]

## 2020-02-17 ENCOUNTER — Ambulatory Visit (INDEPENDENT_AMBULATORY_CARE_PROVIDER_SITE_OTHER): Payer: Medicare HMO | Admitting: Family Medicine

## 2020-02-17 ENCOUNTER — Other Ambulatory Visit: Payer: Self-pay

## 2020-02-17 VITALS — BP 138/68 | HR 95 | Ht 63.0 in | Wt 201.4 lb

## 2020-02-17 DIAGNOSIS — Z23 Encounter for immunization: Secondary | ICD-10-CM

## 2020-02-17 DIAGNOSIS — Z131 Encounter for screening for diabetes mellitus: Secondary | ICD-10-CM

## 2020-02-17 DIAGNOSIS — R5383 Other fatigue: Secondary | ICD-10-CM | POA: Diagnosis not present

## 2020-02-17 DIAGNOSIS — Z13220 Encounter for screening for lipoid disorders: Secondary | ICD-10-CM

## 2020-02-17 DIAGNOSIS — I10 Essential (primary) hypertension: Secondary | ICD-10-CM

## 2020-02-17 DIAGNOSIS — R202 Paresthesia of skin: Secondary | ICD-10-CM

## 2020-02-17 DIAGNOSIS — R200 Anesthesia of skin: Secondary | ICD-10-CM

## 2020-02-17 DIAGNOSIS — D510 Vitamin B12 deficiency anemia due to intrinsic factor deficiency: Secondary | ICD-10-CM | POA: Diagnosis not present

## 2020-02-17 DIAGNOSIS — E7800 Pure hypercholesterolemia, unspecified: Secondary | ICD-10-CM

## 2020-02-17 LAB — POCT GLYCOSYLATED HEMOGLOBIN (HGB A1C): Hemoglobin A1C: 5.1 % (ref 4.0–5.6)

## 2020-02-17 MED ORDER — AMLODIPINE BESYLATE 5 MG PO TABS
5.0000 mg | ORAL_TABLET | Freq: Every day | ORAL | 3 refills | Status: DC
Start: 2020-02-17 — End: 2020-08-05

## 2020-02-17 NOTE — Assessment & Plan Note (Addendum)
Patient endorses numbness and tingling in both feet, mostly over the toes. Normal sensation today. Obtain CMP, TSH, B12 and A1c to investigate potential causes of neuropathy.

## 2020-02-17 NOTE — Assessment & Plan Note (Signed)
Covid vaccine administered today.

## 2020-02-17 NOTE — Patient Instructions (Addendum)
Thank you for coming to see me today. It was a pleasure. I am reducing your Amlodipine dose to 5mg  today.  Hopefully this helps with the symptoms of tiredness.  Please follow-up with me in 2 weeks for a blood pressure check.  If you are still getting the symptoms of tiredness we can stop the chlorthalidone and try different medicine.  Please continue to check your blood pressures at home.  For the tingling in your feet this could be a sign of neuropathy which is nerve damage.  I am screening for causes for this such as thyroid dysfunction, vitamin abnormalities and diabetes etc.  We will get some labs today.  If they are abnormal or we need to do something about them, I will call you.  If they are normal, I will send you a message on MyChart (if it is active) or a letter in the mail.  If you don't hear from [place] in 2 weeks, please call the office at the number below.  If you have any questions or concerns, please do not hesitate to call the office at [phone].  If you develop fevers>100.5, shortness of breath, chest pain, palpitations, dizziness, abdominal pain, nausea, vomiting, diarrhea or cannot eat or drink then please go to the ER immediately.  Best wishes,   Dr Posey Pronto

## 2020-02-17 NOTE — Assessment & Plan Note (Signed)
Flu vaccine administered today.

## 2020-02-17 NOTE — Assessment & Plan Note (Signed)
No previous A1c on file.  Obtained A1c today.

## 2020-02-17 NOTE — Assessment & Plan Note (Signed)
Obtain lipid panel today.  Will consider increasing simvastatin dose if necessary.

## 2020-02-17 NOTE — Assessment & Plan Note (Addendum)
Blood pressure at goal, however patient is likely experiencing fatigue with Chlorthalidone, this was the most recently added BP. I suspect she is getting hypotensive at times due to 3 antihypertensives. Will reduce amlodipine to 5 mg and see if this helps her.  If she continues to feel tired with Chlorthalidone then it is probably a side effect of Chlorthalidone and we can stop this medication at next blood pressure check in 2 weeks. Recommended BP diary at home. Obtained CMP today.

## 2020-02-18 LAB — LIPID PANEL
Chol/HDL Ratio: 2.8 ratio (ref 0.0–4.4)
Cholesterol, Total: 154 mg/dL (ref 100–199)
HDL: 56 mg/dL (ref >39)
LDL Chol Calc (NIH): 86 mg/dL (ref 0–99)
Triglycerides: 61 mg/dL (ref 0–149)
VLDL Cholesterol Cal: 12 mg/dL (ref 5–40)

## 2020-02-18 LAB — COMPREHENSIVE METABOLIC PANEL
ALT: 13 IU/L (ref 0–32)
AST: 18 IU/L (ref 0–40)
Albumin/Globulin Ratio: 1.5 (ref 1.2–2.2)
Albumin: 4.2 g/dL (ref 3.7–4.7)
Alkaline Phosphatase: 87 IU/L (ref 44–121)
BUN/Creatinine Ratio: 19 (ref 12–28)
BUN: 26 mg/dL (ref 8–27)
Bilirubin Total: 0.3 mg/dL (ref 0.0–1.2)
CO2: 18 mmol/L — ABNORMAL LOW (ref 20–29)
Calcium: 9.6 mg/dL (ref 8.7–10.3)
Chloride: 108 mmol/L — ABNORMAL HIGH (ref 96–106)
Creatinine, Ser: 1.4 mg/dL — ABNORMAL HIGH (ref 0.57–1.00)
GFR calc Af Amer: 43 mL/min/{1.73_m2} — ABNORMAL LOW (ref >59)
GFR calc non Af Amer: 37 mL/min/{1.73_m2} — ABNORMAL LOW (ref >59)
Globulin, Total: 2.8 g/dL (ref 1.5–4.5)
Glucose: 94 mg/dL (ref 65–99)
Potassium: 5 mmol/L (ref 3.5–5.2)
Sodium: 142 mmol/L (ref 134–144)
Total Protein: 7 g/dL (ref 6.0–8.5)

## 2020-02-18 LAB — VITAMIN B12: Vitamin B-12: 178 pg/mL — ABNORMAL LOW (ref 232–1245)

## 2020-02-18 LAB — TSH: TSH: 1.59 u[IU]/mL (ref 0.450–4.500)

## 2020-02-22 ENCOUNTER — Telehealth: Payer: Self-pay | Admitting: Family Medicine

## 2020-02-22 ENCOUNTER — Other Ambulatory Visit: Payer: Self-pay | Admitting: Family Medicine

## 2020-02-22 DIAGNOSIS — N289 Disorder of kidney and ureter, unspecified: Secondary | ICD-10-CM

## 2020-02-22 NOTE — Telephone Encounter (Signed)
Called pt to inform her of her lab results. Recommended she comes in this week for repeat lab work to check her kidney function. Arranged lab visit 12/29 at 10am.

## 2020-02-24 ENCOUNTER — Other Ambulatory Visit: Payer: Medicare HMO

## 2020-02-25 ENCOUNTER — Other Ambulatory Visit: Payer: Self-pay

## 2020-02-25 ENCOUNTER — Other Ambulatory Visit: Payer: Medicare HMO

## 2020-02-25 DIAGNOSIS — N289 Disorder of kidney and ureter, unspecified: Secondary | ICD-10-CM | POA: Diagnosis not present

## 2020-02-26 ENCOUNTER — Other Ambulatory Visit: Payer: Self-pay | Admitting: Family Medicine

## 2020-02-26 ENCOUNTER — Telehealth: Payer: Self-pay | Admitting: Family Medicine

## 2020-02-26 LAB — BASIC METABOLIC PANEL
BUN/Creatinine Ratio: 14 (ref 12–28)
BUN: 20 mg/dL (ref 8–27)
CO2: 18 mmol/L — ABNORMAL LOW (ref 20–29)
Calcium: 9.9 mg/dL (ref 8.7–10.3)
Chloride: 109 mmol/L — ABNORMAL HIGH (ref 96–106)
Creatinine, Ser: 1.38 mg/dL — ABNORMAL HIGH (ref 0.57–1.00)
GFR calc Af Amer: 43 mL/min/{1.73_m2} — ABNORMAL LOW (ref >59)
GFR calc non Af Amer: 38 mL/min/{1.73_m2} — ABNORMAL LOW (ref >59)
Glucose: 92 mg/dL (ref 65–99)
Potassium: 5.7 mmol/L — ABNORMAL HIGH (ref 3.5–5.2)
Sodium: 140 mmol/L (ref 134–144)

## 2020-02-26 NOTE — Progress Notes (Signed)
Called patient to discuss recent labs. No answer.  LVM letting patient know that Potassium was 5.7 and that I recommend go to Urgent Care to have labs repeated.  If she cannot go to Urgent  Care today then to hold Vasotec this weekend and reduce potassium in diet.  Follow up on Mon in clinic for repeat labs.  I will try to reach out again this evening.  I also called Tye [place] (daughters) cell to get in touch with patient but no answer.  Carollee Leitz, MD [facility] Medicine Res[facility]

## 2020-02-26 NOTE — Telephone Encounter (Signed)
**  After Hours/ Emergency Line Call**  Had blood work yesterday and received a call from Dr. Volanda Napoleon that her potassium was elevated. Reviewed phone note with her and discussed stopping enalapril. Patient will go to urgent care tomorrow for repeat labs.   Gerlene Fee, DO PGY-2, [facility] 02/26/2020 7:42 PM

## 2020-02-26 NOTE — Telephone Encounter (Signed)
Called patient to discuss elevated potassium.  No answer.  Tried daughters cell and was unsuccessful.  Carollee Leitz, MD Family Medicine Residency

## 2020-02-27 ENCOUNTER — Other Ambulatory Visit: Payer: Self-pay

## 2020-02-27 ENCOUNTER — Ambulatory Visit (HOSPITAL_COMMUNITY)
Admission: EM | Admit: 2020-02-27 | Discharge: 2020-02-27 | Disposition: A | Discharge status: Home / Self Care | Payer: Medicare HMO | Attending: Family Medicine | Admitting: Family Medicine

## 2020-02-27 ENCOUNTER — Encounter (HOSPITAL_COMMUNITY): Payer: Self-pay | Admitting: Emergency Medicine

## 2020-02-27 ENCOUNTER — Other Ambulatory Visit: Payer: Self-pay | Admitting: Family Medicine

## 2020-02-27 DIAGNOSIS — R899 Unspecified abnormal finding in specimens from other organs, systems and tissues: Secondary | ICD-10-CM | POA: Diagnosis not present

## 2020-02-27 DIAGNOSIS — E875 Hyperkalemia: Secondary | ICD-10-CM

## 2020-02-27 LAB — POTASSIUM: Potassium: 5.7 mmol/L — ABNORMAL HIGH (ref 3.5–5.1)

## 2020-02-27 NOTE — ED Provider Notes (Signed)
  [place]   [id number] 02/27/20 Arrival Time: [place] PLAN:  1. Abnormal laboratory test result     Pending: Labs Reviewed  POTASSIUM   No symptoms.   Follow-up Information    Lattie Haw, MD.   Specialty: Family Medicine Why: As needed. Contact information: [address] [place] [address] [phone]               Reviewed expectations re: course of current medical issues. Questions answered. Outlined signs and symptoms indicating need for more acute intervention. Understanding verbalized. After Visit Summary given.   SUBJECTIVE: History from: patient. Wanda Vaughn is a 75 y.o. female who requests K check. 5.7 at PCP 2 days ago. Was told to get rechecked. No symptoms.   OBJECTIVE:  Vitals:   02/27/20 1417  BP: (!) 143/83  Pulse: 83  Resp: 16  Temp: 98.1 F (36.7 C)  TempSrc: Oral  SpO2: 99%    General appearance: alert; no distress Skin: warm and dry Psychological: alert and cooperative; normal mood and affect  Labs: Results for orders placed or performed in visit on 37/90/24  Basic Metabolic Panel  Result Value Ref Range   Glucose 92 65 - 99 mg/dL   BUN 20 8 - 27 mg/dL   Creatinine, Ser 1.38 (H) 0.57 - 1.00 mg/dL   GFR calc non Af Amer 38 (L) >59 mL/min/1.73   GFR calc Af Amer 43 (L) >59 mL/min/1.73   BUN/Creatinine Ratio 14 12 - 28   Sodium 140 134 - 144 mmol/L   Potassium 5.7 (H) 3.5 - 5.2 mmol/L   Chloride 109 (H) 96 - 106 mmol/L   CO2 18 (L) 20 - 29 mmol/L   Calcium 9.9 8.7 - 10.3 mg/dL   Labs Reviewed  POTASSIUM     No Known Allergies  Past Medical History:  Diagnosis Date  . Hyperlipidemia   . Hypertension   . Stroke [facility]) 2013   Social History   Socioeconomic History  . Marital status: Single    Spouse name: Not on file  . Number of children: Not on file  . Years of education: Not on file  . Highest education level: Not on file  Occupational History  . Not on file  Tobacco Use   . Smoking status: Never Smoker  . Smokeless tobacco: Never Used  Substance and Sexual Activity  . Alcohol use: Never  . Drug use: Never  . Sexual activity: Not on file  Other Topics Concern  . Not on file  Social History Narrative  . Not on file   Social Determinants of Health   Financial Resource Strain: Not on file  Food Insecurity: Not on file  Transportation Needs: Not on file  Physical Activity: Not on file  Stress: Not on file  Social Connections: Not on file  Intimate Partner Violence: Not on file   Family History  Problem Relation Age of Onset  . Heart disease Mother   . Hypertension Mother   . Stroke Mother    History reviewed. No pertinent surgical history.   Vanessa Kick, MD 02/27/20 [phone]

## 2020-02-27 NOTE — ED Triage Notes (Signed)
Pt is here for a potasium check   Reports she was seen by PCP on 12/30 and had blood work done and K was 5.7  Pt is asymptomatic  A&O x4... NAD.... ambulatory

## 2020-03-01 ENCOUNTER — Telehealth: Payer: Self-pay

## 2020-03-01 ENCOUNTER — Telehealth: Payer: Self-pay | Admitting: Family Medicine

## 2020-03-01 ENCOUNTER — Other Ambulatory Visit: Payer: Self-pay | Admitting: Family Medicine

## 2020-03-01 MED ORDER — LOKELMA 10 G PO PACK: 10.0000 g | PACK | Freq: Two times a day (BID) | ORAL | 0 refills | Status: AC

## 2020-03-01 NOTE — Telephone Encounter (Signed)
Repeat K still 5.7 on 1st Jan. Discussed with Dr Gwendlyn Deutscher who recommended [facility] 10mg  BID for 2 doses and to bring pt to [facility] tomorrow for BMP and EKG.  I called pt to inform her that her potassium checked at the urgent care on the weekend was 5.7 and still high. She said she feels very well but is still taking the enalapril which she was advised to stop taking by Dr Volanda Napoleon last week. She stopped taking the Amlodipine instead. I explained that she should stop taking the enalapril immediately and follow up in clinic tomorrow in ATC for repeat labs and EKG. I also explained the dangers of high potassium levels in the blood such as arrhythmias. . Advised her to go to the ED if she develops chest pain, dizziness, palpitations overnight etc. She is happy with this plan.  Lattie Haw MD  PGY-2

## 2020-03-01 NOTE — Telephone Encounter (Signed)
Erroneous

## 2020-03-01 NOTE — Telephone Encounter (Signed)
Patient calls nurse line requesting results from 02/27/2020 UC visit for elevated Potassium. Will forward to PCP.

## 2020-03-01 NOTE — Telephone Encounter (Signed)
Called pt and she will be seen in ATC tomorrow. Please see my telephone note, thank you.

## 2020-03-02 ENCOUNTER — Ambulatory Visit (INDEPENDENT_AMBULATORY_CARE_PROVIDER_SITE_OTHER): Payer: Medicare HMO | Admitting: Family Medicine

## 2020-03-02 ENCOUNTER — Ambulatory Visit (HOSPITAL_COMMUNITY)
Admission: RE | Admit: 2020-03-02 | Discharge: 2020-03-02 | Disposition: A | Discharge status: Home / Self Care | Payer: Medicare HMO | Source: Ambulatory Visit | Attending: Family Medicine | Admitting: Family Medicine

## 2020-03-02 ENCOUNTER — Other Ambulatory Visit: Payer: Self-pay

## 2020-03-02 VITALS — BP 150/70 | HR 106 | Ht 63.0 in | Wt 203.4 lb

## 2020-03-02 DIAGNOSIS — E875 Hyperkalemia: Secondary | ICD-10-CM | POA: Insufficient documentation

## 2020-03-02 DIAGNOSIS — M6281 Muscle weakness (generalized): Secondary | ICD-10-CM | POA: Diagnosis not present

## 2020-03-02 DIAGNOSIS — R000 Tachycardia, unspecified: Secondary | ICD-10-CM | POA: Diagnosis not present

## 2020-03-02 NOTE — Assessment & Plan Note (Signed)
Patient with elevated potassium of 5.7.  She denies any NSAID use has stopped her ACE inhibitor and has no signs of bleeding.  Patient does note muscle weakness. -Patient is status post 2 doses of 10 mg of Lokelma -We will repeat BMP today -We will check CK for report of muscle weakness -EKG completed today

## 2020-03-02 NOTE — Progress Notes (Signed)
    SUBJECTIVE:   CHIEF COMPLAINT / HPI: follow up for hyperkalemia   Patient had metabolic panel drawn at previous visit and was noted to have elevated potassium of 5.7.  Patient states that she has been experiencing muscle weakness for the last 5 to 6 months that has not worsened in the last few weeks.  She denies any evidence of hematuria or hematochezia/melena.  She has since stopped the enalapril when she was informed of her elevated potassium.  She denies taking any NSAIDs.  Patient takes 81 mg of aspirin daily.  Reviewed medication list and it does not include any ARBS/ACEi.  Patient has been tolerating Lokelma and had her last dose 1.5 hours prior to this appointment.  HTN  Blood pressure is mildly elevated today.  Repeat measurement, 150/76. Patient was anxious about her family members waiting outside and the appointment being longer than she anticipated.   PERTINENT  PMH / PSH:  HTN   OBJECTIVE:   BP (!) 150/70   Pulse (!) 106   Ht 5\' 3"  (1.6 m)   Wt 203 lb 6 oz (92.3 kg)   SpO2 99%   BMI 36.03 kg/m   General: female appearing stated age in no acute distress, sitting calmly in exam room chair, uses cane to assist with ambulation  Cardio: Normal S1 and S2, no S3 or S4. Rhythm is regular. No murmurs or rubs.  Bilateral radial pulses palpable Pulm: Clear to auscultation bilaterally, no crackles, wheezing, or diminished breath sounds. Normal respiratory effort, stable on RA Abdomen: Bowel sounds normal. Abdomen soft and non-tender.  Extremities: No peripheral edema. Warm/ well perfused. Neuro: pt alert and oriented x4   EKG: no peaked [place], NSR  ASSESSMENT/PLAN:   Hyperkalemia Patient with elevated potassium of 5.7.  She denies any NSAID use has stopped her ACE inhibitor and has no signs of bleeding.  Patient does note muscle weakness. -Patient is status post 2 doses of 10 mg of Lokelma -We will repeat BMP today -We will check CK for report of muscle weakness -EKG  completed today   F/u in 1 week for repeat BMP   Eulis Foster, MD [place]

## 2020-03-02 NOTE — Patient Instructions (Signed)
It was a pleasure to see you today!  Thank you for choosing [facility] for your primary care.  Wanda Vaughn was seen for elevated potassium.   Our plans for today were:  We will check your blood work to see how your potassium is doing after taking the [facility].  Once we have your repeat levels, I will notify you if you need to continue to take the [facility] for additional doses.    You should return to our clinic in 1 week for re-check of K.   Best Wishes,   Dr. Alba Cory

## 2020-03-03 LAB — BASIC METABOLIC PANEL
BUN/Creatinine Ratio: 16 (ref 12–28)
BUN: 26 mg/dL (ref 8–27)
CO2: 18 mmol/L — ABNORMAL LOW (ref 20–29)
Calcium: 10 mg/dL (ref 8.7–10.3)
Chloride: 108 mmol/L — ABNORMAL HIGH (ref 96–106)
Creatinine, Ser: 1.59 mg/dL — ABNORMAL HIGH (ref 0.57–1.00)
GFR calc Af Amer: 37 mL/min/{1.73_m2} — ABNORMAL LOW (ref >59)
GFR calc non Af Amer: 32 mL/min/{1.73_m2} — ABNORMAL LOW (ref >59)
Glucose: 102 mg/dL — ABNORMAL HIGH (ref 65–99)
Potassium: 5.1 mmol/L (ref 3.5–5.2)
Sodium: 141 mmol/L (ref 134–144)

## 2020-03-03 LAB — CK: Total CK: 114 U/L (ref 32–182)

## 2020-03-08 ENCOUNTER — Telehealth: Payer: Self-pay

## 2020-03-08 NOTE — Telephone Encounter (Signed)
Received fax from pharmacy requesting a PA on [place]. I have placed this is PCP box to completed. I tried to call patient to see if she was able to pick this up, however I had to LVM.   Once filled out return to me.

## 2020-03-09 ENCOUNTER — Ambulatory Visit: Payer: Medicare HMO | Admitting: Family Medicine

## 2020-03-09 NOTE — Telephone Encounter (Signed)
Patient LVM on nurse line stating that she picked up medication on 1/4, however, spoke with pharmacist yesterday regarding being reimbursed through PA.   Talbot Grumbling, RN

## 2020-03-09 NOTE — Telephone Encounter (Signed)
I have received the form and will complete it. Thank you.

## 2020-03-17 ENCOUNTER — Other Ambulatory Visit: Payer: Self-pay | Admitting: Family Medicine

## 2020-03-17 NOTE — Telephone Encounter (Signed)
Patient returning phone call to nurse line to check status of PA.   Will forward to PCP  Talbot Grumbling, RN

## 2020-03-18 NOTE — Telephone Encounter (Signed)
I have completed this form, placed in box for faxing. Apologies for the delay.

## 2020-03-22 ENCOUNTER — Ambulatory Visit: Payer: Medicare HMO | Admitting: Family Medicine

## 2020-03-31 NOTE — Progress Notes (Signed)
     SUBJECTIVE:   CHIEF COMPLAINT / HPI:   Wanda Vaughn is a 75 y.o. female presents for follow up    Hypertension: Patient's current antihypertensive  medications include: Norvasc 5mg  and Chlorthalidone 25mg  daily.  Feels tired after taking amlodipine. Checking BP at home with readings between 104-132 .  Denies any SOB, CP, vision changes, LE edema, medication SEs, or symptoms of hypotension.   Most recent creatinine trend:  Lab Results  Component Value Date   CREATININE 1.59 (H) 03/02/2020   CREATININE 1.38 (H) 02/25/2020   CREATININE 1.40 (H) 02/17/2020     Patient has had a BMP in the past 1 year.  Hyperkalemia  Ace stopped at last visit due to hyperkalemia. S/p 2 doses of lokelma. Denies palpitations or chest pain.  Health maintenance  Covid vaccine: up to date, needs card of proof  Flu vaccine: up to date   Mammogram: deferred until later this year Colonoscopy: deferred until later this year Dexa scan: deferred until later this year   44  PMH / PSH: CKD, HTN   OBJECTIVE:   BP 136/70   Pulse 87   Ht 5\' 3"  (1.6 m)   Wt 202 lb 6.4 oz (91.8 kg)   SpO2 100%   BMI 35.85 kg/m    General: Alert, no acute distress, pleasant, cooperative Cardio: Normal S1 and S2, RRR, no r/m/g Pulm: CTAB, normal work of breathing Abdomen: Bowel sounds normal. Abdomen soft and non-tender.  Extremities: No peripheral edema.  Neuro: Cranial nerves grossly intact   ASSESSMENT/PLAN:   Essential hypertension BP 136/70 in clinic today however patient does report low blood pressures in the 100s.  She feels tired and dizzy. Stop amlodipine 5 mg.  Continue chlorthalidone 25 mg.  Obtained urine protein creatinine ratio today.  CKD (chronic kidney disease) CKD is likely the cause of the stepwise decline in patient's GFR over the last 3 years from 7 to 37-43.  Could be chronic kidney disease stage 3b but would like to monitor this further over the past few months.  Patient is not  on any known nephrotoxic agents.  Precepted patient with Dr. McDiarmid who recommended obtaining intact PTH, ionized calcium, phosphorus and repeat BMP today.  Will refer to nephrology if eGFR worsens and patient has CKD stage IV.  Explained to patient and she is happy with this plan.  Healthcare maintenance Discussed mammogram,colonoscopy and DEXA scan.  Patient is feeling a little anxious about her blood pressure and kidney function and would like like to address 1 problem at a tim right now.  She will discuss the screening at the next visit and is happy to do them this later this year. Pt received PPSV23 vaccine today.      Lattie Haw, MD PGY-2 [place]

## 2020-04-01 ENCOUNTER — Encounter: Payer: Self-pay | Admitting: Family Medicine

## 2020-04-01 ENCOUNTER — Ambulatory Visit (INDEPENDENT_AMBULATORY_CARE_PROVIDER_SITE_OTHER): Payer: Medicare HMO | Admitting: Family Medicine

## 2020-04-01 ENCOUNTER — Other Ambulatory Visit: Payer: Self-pay

## 2020-04-01 VITALS — BP 136/70 | HR 87 | Ht 63.0 in | Wt 202.4 lb

## 2020-04-01 DIAGNOSIS — Z0000 Encounter for general adult medical examination without abnormal findings: Secondary | ICD-10-CM

## 2020-04-01 DIAGNOSIS — I10 Essential (primary) hypertension: Secondary | ICD-10-CM | POA: Diagnosis not present

## 2020-04-01 DIAGNOSIS — N1832 Chronic kidney disease, stage 3b: Secondary | ICD-10-CM

## 2020-04-01 DIAGNOSIS — Z23 Encounter for immunization: Secondary | ICD-10-CM

## 2020-04-01 DIAGNOSIS — N183 Chronic kidney disease, stage 3 unspecified: Secondary | ICD-10-CM | POA: Diagnosis not present

## 2020-04-01 DIAGNOSIS — N189 Chronic kidney disease, unspecified: Secondary | ICD-10-CM | POA: Insufficient documentation

## 2020-04-01 NOTE — Assessment & Plan Note (Addendum)
BP 136/70 in clinic today however patient does report low blood pressures in the 100s.  She feels tired and dizzy. Stop amlodipine 5 mg.  Continue chlorthalidone 25 mg.  Obtained urine protein creatinine ratio today.

## 2020-04-01 NOTE — Assessment & Plan Note (Addendum)
Discussed mammogram,colonoscopy and DEXA scan.  Patient is feeling a little anxious about her blood pressure and kidney function and would like like to address 1 problem at a tim right now.  She will discuss the screening at the next visit and is happy to do them this later this year. Pt received PPSV23 vaccine today.

## 2020-04-01 NOTE — Assessment & Plan Note (Addendum)
CKD is likely the cause of the stepwise decline in patient's GFR over the last 3 years from 64 to 37-43.  Could be chronic kidney disease stage 3b but would like to monitor this further over the past few months.  Patient is not on any known nephrotoxic agents.  Precepted patient with Dr. McDiarmid who recommended obtaining intact PTH, ionized calcium, phosphorus and repeat BMP today.  Will refer to nephrology if eGFR worsens and patient has CKD stage IV.  Explained to patient and she is happy with this plan.

## 2020-04-01 NOTE — Patient Instructions (Addendum)
  Thank you for coming to see me today. It was a pleasure. Today we discussed your blood pressure.  It is on the lower side headache this is why you are feeling dizzy and tired at times I recommend stopping amlodipine.  Continue the chlorthalidone only.  We will get some labs today to assess your kidney function.  If they are abnormal or we need to do something about them, I will call you.  If they are normal, I will send you a message on MyChart (if it is active) or a letter in the mail.  If you don't hear from [place] in 2 weeks, please call the office at the number below.  Please follow-up with me in 4 weeks.  We can talk more about the cancer screening-colonoscopy and mammogram  If you have any questions or concerns, please do not hesitate to call the office at [phone].   Best wishes,   Dr Posey Pronto

## 2020-04-02 ENCOUNTER — Telehealth: Payer: Self-pay | Admitting: Family Medicine

## 2020-04-02 ENCOUNTER — Other Ambulatory Visit: Payer: Self-pay | Admitting: Family Medicine

## 2020-04-02 DIAGNOSIS — N183 Chronic kidney disease, stage 3 unspecified: Secondary | ICD-10-CM

## 2020-04-02 LAB — BASIC METABOLIC PANEL
BUN/Creatinine Ratio: 19 (ref 12–28)
BUN: 31 mg/dL — ABNORMAL HIGH (ref 8–27)
CO2: 20 mmol/L (ref 20–29)
Calcium: 9.7 mg/dL (ref 8.7–10.3)
Chloride: 110 mmol/L — ABNORMAL HIGH (ref 96–106)
Creatinine, Ser: 1.61 mg/dL — ABNORMAL HIGH (ref 0.57–1.00)
GFR calc Af Amer: 36 mL/min/{1.73_m2} — ABNORMAL LOW (ref >59)
GFR calc non Af Amer: 31 mL/min/{1.73_m2} — ABNORMAL LOW (ref >59)
Glucose: 86 mg/dL (ref 65–99)
Potassium: 5.5 mmol/L — ABNORMAL HIGH (ref 3.5–5.2)
Sodium: 141 mmol/L (ref 134–144)

## 2020-04-02 LAB — PTH, INTACT AND CALCIUM: PTH: 96 pg/mL — ABNORMAL HIGH (ref 15–65)

## 2020-04-02 LAB — PHOSPHORUS: Phosphorus: 3.3 mg/dL (ref 3.0–4.3)

## 2020-04-02 LAB — PROTEIN / CREATININE RATIO, URINE
Creatinine, Urine: 85.6 mg/dL
Protein, Ur: 7.9 mg/dL
Protein/Creat Ratio: 92 mg/g creat (ref 0–200)

## 2020-04-02 MED ORDER — LOKELMA 10 G PO PACK: 10.0000 g | PACK | Freq: Two times a day (BID) | ORAL | 0 refills | Status: DC

## 2020-04-02 NOTE — Progress Notes (Signed)
Wanda Vaughn called the after-hours lines to make sure she was taking her new medication correctly.  She notes she was recently prescribed Lokelma to help lower her potassium.  She was able to read me the directions on her prescription which I interpreted to mean she should take her first dose of Lokelma tomorrow morning and her second dose of Lokelma tomorrow evening.  She was grateful for this advice.

## 2020-04-02 NOTE — Telephone Encounter (Signed)
Called pt to inform her of her lab results yesterday. Explained that she her potassium is high again and that she needs lokelma for 2 doses and that due to her decline in GFR I will now refer her to nephrology. She has had trouble affording lokelma. I have called the pharmacy and it is around $30-45. Pt will collect it later today if she has the money. Booked ATC app next week where pt will need another BMP and further education about CKD. Hopefully you can be given a medication gift card to help her afford her medications. She is quite anxious about her kidneys. I did reassure that she does not need dialysis but it is best that we can her in to see a nephrologist soon. Safety precautions provided to pt.

## 2020-04-03 NOTE — Progress Notes (Signed)
    SUBJECTIVE:   CHIEF COMPLAINT / HPI: follow up for potassium   Patient was prescribed Lokelma for elevated K of 5.5. She took two doses with her last being on 2.7.22. Today she reports for blood work to check her levels.  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  Patient states that she has not heard from nephrology regarding the referral appointment at this time.  She reports no decreased urine output, denies recent NSAID use, no palpitations, no muscle weakness, no numbness, no cramps.  She has not had any abdominal pain or diarrhea however patient states that she has been more anxious worried about her CKD and has been eating less so her bowel movement frequency has slightly decreased. Patient has questions about chronic kidney disease and asked for assistance with changing from [place] so that she can access her lab values.  PERTINENT  PMH / PSH:  Hyperkalemia  CKD  OBJECTIVE:   BP 124/66   Pulse 96   Ht '5\' 3"'$  (1.6 m)   Wt 200 lb 9.6 oz (91 kg)   SpO2 99%   BMI 35.53 kg/m   General: female appearing stated age in no acute distress Cardio: Normal S1 and S2, no S3 or S4. Rhythm is irregularly irregular.  Bilateral radial pulses palpable Pulm: Clear to auscultation bilaterally, no crackles, wheezing, or diminished breath sounds. Normal respiratory effort, stable on room air Abdomen: Bowel sounds normal. Abdomen soft and non-tender. Extremities: Trace peripheral edema bilateral lower extremities   ASSESSMENT/PLAN:   Hyperkalemia Patient tolerated 2 doses of Lokelma.  Patient states that this medication was expensive so if she will need additional dosing, will need to work with pharmacy team to help with cost. 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 Posey Pronto on 2/5     Eulis Foster, MD [place]

## 2020-04-05 ENCOUNTER — Ambulatory Visit (INDEPENDENT_AMBULATORY_CARE_PROVIDER_SITE_OTHER): Payer: Medicare HMO | Admitting: Family Medicine

## 2020-04-05 ENCOUNTER — Other Ambulatory Visit: Payer: Self-pay

## 2020-04-05 VITALS — BP 124/66 | HR 96 | Ht 63.0 in | Wt 200.6 lb

## 2020-04-05 DIAGNOSIS — E875 Hyperkalemia: Secondary | ICD-10-CM | POA: Diagnosis not present

## 2020-04-05 DIAGNOSIS — N183 Chronic kidney disease, stage 3 unspecified: Secondary | ICD-10-CM | POA: Diagnosis not present

## 2020-04-05 NOTE — Assessment & Plan Note (Addendum)
Patient tolerated 2 doses of Lokelma.  Patient states that this medication was expensive so if she will need additional dosing, will need to work with pharmacy team to help with cost. 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 Posey Pronto on 2/5

## 2020-04-05 NOTE — Patient Instructions (Addendum)
Today we we will check your lab work to look for your potassium levels.  We will follow-up if results are abnormal.  Please plan to follow-up with your primary doctor in 4 weeks  I will supply some general information below   .  Please notify our office if you do not hear from nephrology regarding your referral appointment within the next 2 weeks.  Chronic Kidney Disease, Adult Chronic kidney disease (CKD) occurs when the kidneys are slowly and permanently damaged over a long period of time. The kidneys are a pair of organs that do many important jobs in the body, including:  Removing waste and extra fluid from the blood to make urine.  Making hormones that maintain the amount of fluid in tissues and blood vessels.  Maintaining the right amount of fluids and chemicals in the body. A small amount of kidney damage may not cause problems, but a large amount of damage may make it hard or impossible for the kidneys to work right. Steps must be taken to slow kidney damage or to stop it from getting worse. If steps are not taken, the kidneys may stop working permanently (end-stage renal disease, or ESRD). Most of the time, CKD does not go away, but it can often be controlled. People who have CKD are usually able to live full lives. What are the causes? The most common causes of this condition are diabetes and high blood pressure (hypertension). Other causes include:  Cardiovascular diseases. These affect the heart and blood vessels.  Kidney diseases. These include: ? Glomerulonephritis, or inflammation of the tiny filters in the kidneys. ? Interstitial nephritis. This is swelling of the small tubes of the kidneys and of the surrounding structures. ? Polycystic kidney disease, in which clusters of fluid-filled sacs form within the kidneys. ? Renal vascular disease. This includes disorders that affect the arteries and veins of the kidneys.  Diseases that affect the body's defense system (immune  system).  A problem with urine flow. This may be caused by: ? Kidney stones. ? Cancer. ? An enlarged prostate, in males.  A kidney infection or urinary tract infection (UTI) that keeps coming back.  Vasculitis. This is swelling or inflammation of the blood vessels. What increases the risk? Your chances of having kidney disease increase with age. The following factors may make you more likely to develop this condition:  A family history of kidney disease or kidney failure. Kidney failure means the kidneys can no longer work right.  Certain genetic diseases.  Taking medicines often that are damaging to the kidneys.  Being around or being in contact with toxic substances.  Obesity.  A history of tobacco use. What are the signs or symptoms? Symptoms of this condition include:  Feeling very tired (lethargic) and having less energy.  Swelling, or edema, of the face, legs, ankles, or feet.  Nausea or vomiting, or loss of appetite.  Confusion or trouble concentrating.  Muscle twitches and cramps, especially in the legs.  Dry, itchy skin.  A metallic taste in the mouth.  Producing less urine, or producing more urine (especially at night).  Shortness of breath.  Trouble sleeping. CKD may also result in not having enough red blood cells or hemoglobin in the blood (anemia) or having weak bones (bone disease). Symptoms develop slowly and may not be obvious until the kidney damage becomes severe. It is possible to have kidney disease for years without having symptoms. How is this diagnosed? This condition may be diagnosed based on:  Blood tests.  Urine tests.  Imaging tests, such as an ultrasound or a CT scan.  A kidney biopsy. This involves removing a sample of kidney tissue to be looked at under a microscope. Results from these tests will help to determine how serious the CKD is. How is this treated? There is no cure for most cases of this condition, but treatment  usually relieves symptoms and prevents or slows the worsening of the disease. Treatment may include:  Diet changes, which may require you to avoid alcohol and foods that are high in salt, potassium, phosphorous, and protein.  Medicines. These may: ? Lower blood pressure. ? Control blood sugar (glucose). ? Relieve anemia. ? Relieve swelling. ? Protect your bones. ? Improve the balance of salts and minerals in your blood (electrolytes).  Dialysis, which is a type of treatment that removes toxic waste from the body. It may be needed if you have kidney failure.  Managing any other conditions that are causing your CKD or making it worse. Follow these instructions at home: Medicines  Take over-the-counter and prescription medicines only as told by your health care provider. The amount of some medicines that you take may need to be changed.  Do not take any new medicines unless approved by your health care provider. Many medicines can make kidney damage worse.  Do not take any vitamin and mineral supplements unless approved by your health care provider. Many nutritional supplements can make kidney damage worse. Lifestyle  Do not use any products that contain nicotine or tobacco, such as cigarettes, e-cigarettes, and chewing tobacco. If you need help quitting, ask your health care provider.  If you drink alcohol: ? Limit how much you use to:  0-1 drink a day for women who are not pregnant.  0-2 drinks a day for men. ? Know how much alcohol is in your drink. In the [place] one drink equals one 12 oz bottle of beer (355 mL), one 5 oz glass of wine (148 mL), or one 1 oz glass of hard liquor (44 mL).  Maintain a healthy weight. If you need help, ask your health care provider.   General instructions  Follow instructions from your health care provider about eating or drinking restrictions, including any prescribed diet.  Track your blood pressure at home. Report changes in your blood  pressure as told.  If you are being treated for diabetes, track your blood glucose levels as told.  Start or continue an exercise plan. Exercise at least 30 minutes a day, 5 days a week.  Keep your immunizations up to date as told.  Keep all follow-up visits. This is important.   Where to find more information  American Association of Kidney Patients: BombTimer.gl  National Kidney Foundation: www.kidney.[place]: https://mathis.com/  Life Options: www.lifeoptions.org  Kidney School: www.kidneyschool.org Contact a health care provider if:  Your symptoms get worse.  You develop new symptoms. Get help right away if:  You develop symptoms of ESRD. These include: ? Headaches. ? Numbness in your hands or feet. ? Easy bruising. ? Frequent hiccups. ? Chest pain. ? Shortness of breath. ? Lack of menstrual periods, in women.  You have a fever.  You are producing less urine than usual.  You have pain or bleeding when you urinate or when you have a bowel movement. These symptoms may represent a serious problem that is an emergency. Do not wait to see if the symptoms will go away. Get medical help right away. Call  your local emergency services (911 in the [place] Do not drive yourself to the hospital. Summary  Chronic kidney disease (CKD) occurs when the kidneys become damaged slowly over a long period of time.  The most common causes of this condition are diabetes and high blood pressure (hypertension).  There is no cure for most cases of CKD, but treatment usually relieves symptoms and prevents or slows the worsening of the disease. Treatment may include a combination of lifestyle changes, medicines, and dialysis. This information is not intended to replace advice given to you by your health care provider. Make sure you discuss any questions you have with your health care provider. Document Revised: 05/20/2019 Document Reviewed: 05/20/2019 Elsevier Patient Education   [place].

## 2020-04-06 LAB — BASIC METABOLIC PANEL
BUN/Creatinine Ratio: 19 (ref 12–28)
BUN: 32 mg/dL — ABNORMAL HIGH (ref 8–27)
CO2: 19 mmol/L — ABNORMAL LOW (ref 20–29)
Calcium: 10 mg/dL (ref 8.7–10.3)
Chloride: 110 mmol/L — ABNORMAL HIGH (ref 96–106)
Creatinine, Ser: 1.69 mg/dL — ABNORMAL HIGH (ref 0.57–1.00)
GFR calc Af Amer: 34 mL/min/{1.73_m2} — ABNORMAL LOW (ref >59)
GFR calc non Af Amer: 29 mL/min/{1.73_m2} — ABNORMAL LOW (ref >59)
Glucose: 97 mg/dL (ref 65–99)
Potassium: 4.4 mmol/L (ref 3.5–5.2)
Sodium: 143 mmol/L (ref 134–144)

## 2020-05-13 ENCOUNTER — Ambulatory Visit: Payer: Medicare HMO | Admitting: Family Medicine

## 2020-05-16 DIAGNOSIS — N1832 Chronic kidney disease, stage 3b: Secondary | ICD-10-CM | POA: Diagnosis not present

## 2020-05-16 DIAGNOSIS — I129 Hypertensive chronic kidney disease with stage 1 through stage 4 chronic kidney disease, or unspecified chronic kidney disease: Secondary | ICD-10-CM | POA: Diagnosis not present

## 2020-05-16 DIAGNOSIS — E875 Hyperkalemia: Secondary | ICD-10-CM | POA: Diagnosis not present

## 2020-05-16 DIAGNOSIS — I639 Cerebral infarction, unspecified: Secondary | ICD-10-CM | POA: Diagnosis not present

## 2020-05-16 DIAGNOSIS — N189 Chronic kidney disease, unspecified: Secondary | ICD-10-CM | POA: Diagnosis not present

## 2020-05-16 DIAGNOSIS — E785 Hyperlipidemia, unspecified: Secondary | ICD-10-CM | POA: Diagnosis not present

## 2020-05-16 DIAGNOSIS — D509 Iron deficiency anemia, unspecified: Secondary | ICD-10-CM | POA: Diagnosis not present

## 2020-05-18 ENCOUNTER — Other Ambulatory Visit: Payer: Self-pay | Admitting: Internal Medicine

## 2020-05-18 DIAGNOSIS — E875 Hyperkalemia: Secondary | ICD-10-CM | POA: Diagnosis not present

## 2020-05-18 DIAGNOSIS — N1832 Chronic kidney disease, stage 3b: Secondary | ICD-10-CM

## 2020-05-26 DIAGNOSIS — N1832 Chronic kidney disease, stage 3b: Secondary | ICD-10-CM | POA: Diagnosis not present

## 2020-06-02 ENCOUNTER — Ambulatory Visit: Payer: Medicare HMO | Admitting: Family Medicine

## 2020-06-10 ENCOUNTER — Ambulatory Visit
Admission: RE | Admit: 2020-06-10 | Discharge: 2020-06-10 | Disposition: A | Discharge status: Home / Self Care | Payer: Medicare HMO | Source: Ambulatory Visit | Attending: Internal Medicine | Admitting: Internal Medicine

## 2020-06-10 ENCOUNTER — Other Ambulatory Visit: Payer: Self-pay

## 2020-06-10 DIAGNOSIS — N281 Cyst of kidney, acquired: Secondary | ICD-10-CM | POA: Diagnosis not present

## 2020-06-10 DIAGNOSIS — N189 Chronic kidney disease, unspecified: Secondary | ICD-10-CM | POA: Diagnosis not present

## 2020-06-10 DIAGNOSIS — N1832 Chronic kidney disease, stage 3b: Secondary | ICD-10-CM

## 2020-06-23 DIAGNOSIS — N1832 Chronic kidney disease, stage 3b: Secondary | ICD-10-CM | POA: Diagnosis not present

## 2020-06-27 ENCOUNTER — Other Ambulatory Visit: Payer: Self-pay | Admitting: *Deleted

## 2020-06-28 MED ORDER — SIMVASTATIN 20 MG PO TABS: 20.0000 mg | ORAL_TABLET | Freq: Every day | ORAL | 3 refills | Status: DC

## 2020-06-30 DIAGNOSIS — N1832 Chronic kidney disease, stage 3b: Secondary | ICD-10-CM | POA: Diagnosis not present

## 2020-07-08 DIAGNOSIS — E213 Hyperparathyroidism, unspecified: Secondary | ICD-10-CM | POA: Diagnosis not present

## 2020-07-08 DIAGNOSIS — N1832 Chronic kidney disease, stage 3b: Secondary | ICD-10-CM | POA: Diagnosis not present

## 2020-07-08 DIAGNOSIS — E875 Hyperkalemia: Secondary | ICD-10-CM | POA: Diagnosis not present

## 2020-07-08 DIAGNOSIS — I639 Cerebral infarction, unspecified: Secondary | ICD-10-CM | POA: Diagnosis not present

## 2020-07-08 DIAGNOSIS — E785 Hyperlipidemia, unspecified: Secondary | ICD-10-CM | POA: Diagnosis not present

## 2020-07-08 DIAGNOSIS — I129 Hypertensive chronic kidney disease with stage 1 through stage 4 chronic kidney disease, or unspecified chronic kidney disease: Secondary | ICD-10-CM | POA: Diagnosis not present

## 2020-07-12 ENCOUNTER — Ambulatory Visit: Payer: Medicare HMO | Admitting: Family Medicine

## 2020-08-03 NOTE — Progress Notes (Addendum)
     SUBJECTIVE:   CHIEF COMPLAINT / HPI:   Wanda Vaughn is a 75 y.o. female presents for follow-up  CKD Saw Dr Amedeo Plenty 1 month ago and has seen them several times. Pt states that they made some medication changes. Chlorthalidone '50mg'$  once daily, sodium  bicarbonate '650mg'$  BID, vitamin D and B12. She would like to know about the US renal results.   HTN Pt reports chlorthalidone makes her feel uncomfortable, dizzy and not stable.  Nephrology increased chlorthalidone dose from 50 to 100 mg last month.  [place] Office Visit from 08/05/2020 in [place]  PHQ-9 Total Score 0        PERTINENT  PMH / PSH: HTN, CKD  OBJECTIVE:   BP (!) 142/78   Pulse 76   Ht '5\' 3"'$  (1.6 m)   Wt 203 lb (92.1 kg)   SpO2 100%   BMI 35.96 kg/m    General: Alert, no acute distress Cardio: Normal S1 and S2, RRR, no r/m/g Pulm: CTAB, normal work of breathing Abdomen: Bowel sounds normal. Abdomen soft and non-tender.  Extremities: No peripheral edema.  Neuro: Cranial nerves grossly intact   ASSESSMENT/PLAN:   CKD (chronic kidney disease) Explained renal ultrasound results to patient.  I am unable to see nephrology notes so was unable to explain her recent lab work and other tests that they may have done.  Obtained BMP today per patient's request.  Essential hypertension BP 142/78.  Patient experiencing side effects of dizziness at chlorthalidone dose of 50 mg.  Recommended reducing chlorthalidone to 25 mg daily which she felt better on..  I have asked her to let her nephrologist know about this medication change.  Healthcare maintenance Discussed the importance of obtaining DEXA and mammogram.  Patient is considering this and she will let me know when she is ready to get them done. Obtained hep c Ab today.  BMI 35.0-35.9,adult Weight gain of 3lb since previous visit.  Unlikely to be water retention based on physical exam today.  Her nephrology said that she should  work on weight loss.  Discussed referral to healthy weight and wellness and patient is open to this.  Placed referral.     Wanda Haw, MD PGY-2 [place]

## 2020-08-05 ENCOUNTER — Other Ambulatory Visit: Payer: Self-pay

## 2020-08-05 ENCOUNTER — Ambulatory Visit (INDEPENDENT_AMBULATORY_CARE_PROVIDER_SITE_OTHER): Payer: Medicare HMO | Admitting: Family Medicine

## 2020-08-05 ENCOUNTER — Encounter: Payer: Self-pay | Admitting: Family Medicine

## 2020-08-05 VITALS — BP 142/78 | HR 76 | Ht 63.0 in | Wt 203.0 lb

## 2020-08-05 DIAGNOSIS — I120 Hypertensive chronic kidney disease with stage 5 chronic kidney disease or end stage renal disease: Secondary | ICD-10-CM

## 2020-08-05 DIAGNOSIS — Z6835 Body mass index (BMI) 35.0-35.9, adult: Secondary | ICD-10-CM | POA: Diagnosis not present

## 2020-08-05 DIAGNOSIS — N183 Chronic kidney disease, stage 3 unspecified: Secondary | ICD-10-CM | POA: Diagnosis not present

## 2020-08-05 DIAGNOSIS — I10 Essential (primary) hypertension: Secondary | ICD-10-CM

## 2020-08-05 DIAGNOSIS — Z1159 Encounter for screening for other viral diseases: Secondary | ICD-10-CM | POA: Diagnosis not present

## 2020-08-05 DIAGNOSIS — N185 Chronic kidney disease, stage 5: Secondary | ICD-10-CM | POA: Diagnosis not present

## 2020-08-05 DIAGNOSIS — Z0000 Encounter for general adult medical examination without abnormal findings: Secondary | ICD-10-CM

## 2020-08-05 NOTE — Assessment & Plan Note (Signed)
Weight gain of 3lb since previous visit.  Unlikely to be water retention based on physical exam today.  Her nephrology said that she should work on weight loss.  Discussed referral to healthy weight and wellness and patient is open to this.  Placed referral.

## 2020-08-05 NOTE — Assessment & Plan Note (Signed)
BP 142/78.  Patient experiencing side effects of dizziness at chlorthalidone dose of 50 mg.  Recommended reducing chlorthalidone to 25 mg daily which she felt better on..  I have asked her to let her nephrologist know about this medication change.

## 2020-08-05 NOTE — Assessment & Plan Note (Addendum)
Discussed the importance of obtaining DEXA and mammogram.  Patient is considering this and she will let me know when she is ready to get them done. Obtained hep c Ab today.

## 2020-08-05 NOTE — Assessment & Plan Note (Signed)
Explained renal ultrasound results to patient.  I am unable to see nephrology notes so was unable to explain her recent lab work and other tests that they may have done.  Obtained BMP today per patient's request.

## 2020-08-06 ENCOUNTER — Other Ambulatory Visit: Payer: Self-pay | Admitting: Family Medicine

## 2020-08-06 LAB — BASIC METABOLIC PANEL
BUN/Creatinine Ratio: 20 (ref 12–28)
BUN: 33 mg/dL — ABNORMAL HIGH (ref 8–27)
CO2: 19 mmol/L — ABNORMAL LOW (ref 20–29)
Calcium: 10.1 mg/dL (ref 8.7–10.3)
Chloride: 107 mmol/L — ABNORMAL HIGH (ref 96–106)
Creatinine, Ser: 1.63 mg/dL — ABNORMAL HIGH (ref 0.57–1.00)
Glucose: 93 mg/dL (ref 65–99)
Potassium: 6.4 mmol/L — ABNORMAL HIGH (ref 3.5–5.2)
Sodium: 139 mmol/L (ref 134–144)
eGFR: 33 mL/min/{1.73_m2} — ABNORMAL LOW (ref >59)

## 2020-08-06 LAB — HEPATITIS C ANTIBODY: Hep C Virus Ab: 0.2 s/co ratio (ref 0.0–0.9)

## 2020-08-06 MED ORDER — SODIUM POLYSTYRENE SULFONATE 15 GM/60ML PO SUSP: 30.0000 g | Freq: Every day | ORAL | 0 refills | Status: DC

## 2020-08-07 ENCOUNTER — Other Ambulatory Visit: Payer: Self-pay | Admitting: Family Medicine

## 2020-08-07 ENCOUNTER — Telehealth: Payer: Self-pay | Admitting: Family Medicine

## 2020-08-07 NOTE — Telephone Encounter (Signed)
Called pt to let her know about her elevated potassium. I explained that I contacted Dr Jonnie Finner over the weekend and he recommended 30g Kayexalate daily for the next 2 days and repeat BMP with nephrology next Monday or Tuesday. Also recommended low potassium diet to pt which she is already following. Answered her questions and she is happy with the plan.

## 2020-08-07 NOTE — Telephone Encounter (Signed)
**  After Hours/ Emergency Line Call**  Received a call to report that Wanda Vaughn she was ordered Kayexalate for increased potassium however she reports that her pharmacy does not have that medication in stock and can pick up tomorrow.   She does not have another pharmacy that I can send this medication to.  She reports having taken Lokelma before and has some at home.  Recommend she take a dose of Lokelma now and get kayexalate tomorrow to start as prescribed by PCP.   She will need to follow up with labs at [facility] on [address] 06/15.  Red flags discussed.  Will forward to PCP.  Carollee Leitz MD PGY-2, [facility] 08/07/2020 7:40 PM

## 2020-08-08 ENCOUNTER — Telehealth: Payer: Self-pay

## 2020-08-08 NOTE — Telephone Encounter (Signed)
Please inform pt that she can take Lokelma '10mg'$  three times a day for 2 days only. She does not have to collect any other medications if she has loklema at home as they all lower potassium levels. She needs labs ASAP with [facility], pt is aware of this. Thank you

## 2020-08-08 NOTE — Telephone Encounter (Signed)
Fax from [facility]: SPS 15GM/60ML suspension is not covered by pt insurance plan. The preferred alternative is sodiumpolystyrenesulfonate. Please call/fax the pharmacy to change medication along with strength,direction and quantity and # of refills. Ottis Stain, CMA

## 2020-08-08 NOTE — Telephone Encounter (Signed)
Patient calls nurse line wanting to speak with PCP about Lokelma. See after hours line documentation. Patient reports she forgot what "the on call doctor told me to do." I advised patient she was to take one dose of Lokelma yesterday and pick up sodium polystyrene today to start. Patient reports sodium polystyrene is too expensive for her, therefore she did not pick up. However, she does have Lokelma at home. Will forward to PCP for advisement.

## 2020-08-09 NOTE — Telephone Encounter (Signed)
Attempted to call patient multiple times to discuss, however LVM.

## 2020-08-17 DIAGNOSIS — N1832 Chronic kidney disease, stage 3b: Secondary | ICD-10-CM | POA: Diagnosis not present

## 2020-09-08 DIAGNOSIS — E785 Hyperlipidemia, unspecified: Secondary | ICD-10-CM | POA: Diagnosis not present

## 2020-09-08 DIAGNOSIS — I129 Hypertensive chronic kidney disease with stage 1 through stage 4 chronic kidney disease, or unspecified chronic kidney disease: Secondary | ICD-10-CM | POA: Diagnosis not present

## 2020-09-08 DIAGNOSIS — E213 Hyperparathyroidism, unspecified: Secondary | ICD-10-CM | POA: Diagnosis not present

## 2020-09-08 DIAGNOSIS — E875 Hyperkalemia: Secondary | ICD-10-CM | POA: Diagnosis not present

## 2020-09-08 DIAGNOSIS — N1832 Chronic kidney disease, stage 3b: Secondary | ICD-10-CM | POA: Diagnosis not present

## 2020-09-08 DIAGNOSIS — I639 Cerebral infarction, unspecified: Secondary | ICD-10-CM | POA: Diagnosis not present

## 2020-10-07 ENCOUNTER — Telehealth: Payer: Self-pay

## 2020-10-07 NOTE — Telephone Encounter (Signed)
LVM to have pt call back to schedule AWV.   RE: confirm insurance and schedule AWV on my schedule if times are convenient for patient or other AWV schedule as template permits.   

## 2020-10-26 ENCOUNTER — Encounter (INDEPENDENT_AMBULATORY_CARE_PROVIDER_SITE_OTHER): Payer: Self-pay

## 2020-11-01 ENCOUNTER — Encounter (INDEPENDENT_AMBULATORY_CARE_PROVIDER_SITE_OTHER): Payer: Self-pay

## 2020-11-01 DIAGNOSIS — E875 Hyperkalemia: Secondary | ICD-10-CM | POA: Diagnosis not present

## 2020-11-01 DIAGNOSIS — E213 Hyperparathyroidism, unspecified: Secondary | ICD-10-CM | POA: Diagnosis not present

## 2020-11-01 DIAGNOSIS — N1832 Chronic kidney disease, stage 3b: Secondary | ICD-10-CM | POA: Diagnosis not present

## 2020-11-01 DIAGNOSIS — I639 Cerebral infarction, unspecified: Secondary | ICD-10-CM | POA: Diagnosis not present

## 2020-11-01 DIAGNOSIS — I129 Hypertensive chronic kidney disease with stage 1 through stage 4 chronic kidney disease, or unspecified chronic kidney disease: Secondary | ICD-10-CM | POA: Diagnosis not present

## 2021-02-10 ENCOUNTER — Ambulatory Visit: Payer: Medicare HMO | Admitting: Student

## 2021-03-21 ENCOUNTER — Ambulatory Visit: Payer: Medicare HMO | Admitting: Family Medicine

## 2021-03-23 NOTE — Progress Notes (Addendum)
° ° ° °  SUBJECTIVE:   CHIEF COMPLAINT / HPI:   Wanda Vaughn is a 76 y.o. female presents for HTN follow up  Hypertension Patient's current antihypertensive  medications include: chlorthalidone 25mg  daily. Compliant with medications and tolerating well without side effects.  Checking BP at home with readings between 161 and 096 systolic. Denies any SOB, CP, vision changes, LE edema, medication SEs, or symptoms of hypotension.   Most recent creatinine trend:  Lab Results  Component Value Date   CREATININE 1.63 (H) 08/05/2020   CREATININE 1.69 (H) 04/05/2020   CREATININE 1.61 (H) 04/01/2020    Patient has had a BMP in the past 1 year.  Numbness and tingling in toes Pts reports on going numbness tingling in both toes which has improved. Improved on rest, worse during the day when sitting for long period of time. Toes feel cold. Denies being pale. No pain when walking.  Would like kidney function function. Takes 5mg  lokelma 3 times a week.   [place] Office Visit from 03/24/2021 in [place]  PHQ-9 Total Score 0        PERTINENT  PMH / PSH: Hypertension, CKD  OBJECTIVE:   BP 126/82    Pulse 88    Ht 5\' 3"  (1.6 m)    Wt 206 lb 3.2 oz (93.5 kg)    SpO2 100%    BMI 36.53 kg/m    General: Alert, no acute distress Cardio: Well-perfused Pulm: normal work of breathing Neuro: Cranial nerves grossly intact   ASSESSMENT/PLAN:   Essential hypertension At goal. Continue Chlorthalidone 25mg .  Obtained BMP today.  Numbness and tingling of both feet Pt still has on going symptoms over toes, relieved with rest. Could be linked to B12 deficiency as last year her B12 levels was 178. TSH and A1c wnl at this time.  We will recheck E45 and folic acid and treat accordingly if low. If continues despite treatment then can consider nerve conduction studies.  Healthcare maintenance Received flu shot today.  Recommended patient gets his shingles vaccines at pharmacy.     Lattie Haw, MD PGY-3 [place]

## 2021-03-24 ENCOUNTER — Ambulatory Visit (INDEPENDENT_AMBULATORY_CARE_PROVIDER_SITE_OTHER): Payer: Medicare HMO | Admitting: Family Medicine

## 2021-03-24 ENCOUNTER — Other Ambulatory Visit: Payer: Self-pay

## 2021-03-24 ENCOUNTER — Encounter: Payer: Self-pay | Admitting: Family Medicine

## 2021-03-24 VITALS — BP 126/82 | HR 88 | Ht 63.0 in | Wt 206.2 lb

## 2021-03-24 DIAGNOSIS — Z23 Encounter for immunization: Secondary | ICD-10-CM

## 2021-03-24 DIAGNOSIS — R202 Paresthesia of skin: Secondary | ICD-10-CM | POA: Diagnosis not present

## 2021-03-24 DIAGNOSIS — Z0000 Encounter for general adult medical examination without abnormal findings: Secondary | ICD-10-CM

## 2021-03-24 DIAGNOSIS — R200 Anesthesia of skin: Secondary | ICD-10-CM | POA: Diagnosis not present

## 2021-03-24 DIAGNOSIS — I10 Essential (primary) hypertension: Secondary | ICD-10-CM | POA: Diagnosis not present

## 2021-03-24 DIAGNOSIS — N189 Chronic kidney disease, unspecified: Secondary | ICD-10-CM | POA: Diagnosis not present

## 2021-03-24 NOTE — Assessment & Plan Note (Signed)
Received flu shot today.  Recommended patient gets his shingles vaccines at pharmacy.

## 2021-03-24 NOTE — Assessment & Plan Note (Addendum)
Pt still has on going symptoms over toes, relieved with rest. Could be linked to B12 deficiency as last year her B12 levels was 178. TSH and A1c wnl at this time.  We will recheck U54 and folic acid and treat accordingly if low. If continues despite treatment then can consider nerve conduction studies.

## 2021-03-24 NOTE — Assessment & Plan Note (Addendum)
At goal. Continue Chlorthalidone 25mg .  Obtained BMP today.

## 2021-03-24 NOTE — Patient Instructions (Signed)
Thank you for coming to see me today. It was a pleasure. Today we discussed your BP it looks great, no changes made. For numbness and tingling-I dont know why you are having this. If it gets worse then let me know we can do nerve conduction study.   We will get some labs today.  If they are abnormal or we need to do something about them, I will call you.  If they are normal, I will send you a message on MyChart (if it is active) or a letter in the mail.  If you don't hear from [place] in 2 weeks, please call the office at the number below.   Please follow-up with as and when you need.  If you have any questions or concerns, please do not hesitate to call the office at [phone].  Best wishes,   Dr Posey Pronto

## 2021-03-25 LAB — BASIC METABOLIC PANEL
BUN/Creatinine Ratio: 17 (ref 12–28)
BUN: 30 mg/dL — ABNORMAL HIGH (ref 8–27)
CO2: 22 mmol/L (ref 20–29)
Calcium: 9.9 mg/dL (ref 8.7–10.3)
Chloride: 107 mmol/L — ABNORMAL HIGH (ref 96–106)
Creatinine, Ser: 1.73 mg/dL — ABNORMAL HIGH (ref 0.57–1.00)
Glucose: 92 mg/dL (ref 70–99)
Potassium: 4.5 mmol/L (ref 3.5–5.2)
Sodium: 142 mmol/L (ref 134–144)
eGFR: 30 mL/min/{1.73_m2} — ABNORMAL LOW (ref >59)

## 2021-03-27 ENCOUNTER — Encounter: Payer: Self-pay | Admitting: Family Medicine

## 2021-03-27 ENCOUNTER — Telehealth: Payer: Self-pay | Admitting: Family Medicine

## 2021-03-27 LAB — FOLATE: Folate: 7.7 ng/mL (ref >3.0)

## 2021-03-27 LAB — SPECIMEN STATUS REPORT

## 2021-03-27 LAB — VITAMIN B12: Vitamin B-12: 680 pg/mL (ref 232–1245)

## 2021-03-27 NOTE — Telephone Encounter (Signed)
Patient returned call to nurse line. Discussed results per result letter. Also advised patient that she will be receiving this documentation in the mail.   Talbot Grumbling, RN

## 2021-03-27 NOTE — Telephone Encounter (Signed)
Called pt and left VM explaining stable blood test results from last week. She can call back if she has further questions.

## 2021-04-24 DIAGNOSIS — N1832 Chronic kidney disease, stage 3b: Secondary | ICD-10-CM | POA: Diagnosis not present

## 2021-05-01 ENCOUNTER — Other Ambulatory Visit: Payer: Self-pay | Admitting: Family Medicine

## 2021-05-05 DIAGNOSIS — E875 Hyperkalemia: Secondary | ICD-10-CM | POA: Diagnosis not present

## 2021-05-05 DIAGNOSIS — E213 Hyperparathyroidism, unspecified: Secondary | ICD-10-CM | POA: Diagnosis not present

## 2021-05-05 DIAGNOSIS — N1832 Chronic kidney disease, stage 3b: Secondary | ICD-10-CM | POA: Diagnosis not present

## 2021-05-05 DIAGNOSIS — I639 Cerebral infarction, unspecified: Secondary | ICD-10-CM | POA: Diagnosis not present

## 2021-05-05 DIAGNOSIS — I129 Hypertensive chronic kidney disease with stage 1 through stage 4 chronic kidney disease, or unspecified chronic kidney disease: Secondary | ICD-10-CM | POA: Diagnosis not present

## 2021-08-01 ENCOUNTER — Encounter: Payer: Self-pay | Admitting: *Deleted

## 2021-10-04 ENCOUNTER — Encounter (INDEPENDENT_AMBULATORY_CARE_PROVIDER_SITE_OTHER): Payer: Self-pay

## 2021-11-13 DIAGNOSIS — N1832 Chronic kidney disease, stage 3b: Secondary | ICD-10-CM | POA: Diagnosis not present

## 2021-11-22 DIAGNOSIS — N1832 Chronic kidney disease, stage 3b: Secondary | ICD-10-CM | POA: Diagnosis not present

## 2021-11-22 DIAGNOSIS — D509 Iron deficiency anemia, unspecified: Secondary | ICD-10-CM | POA: Diagnosis not present

## 2021-11-22 DIAGNOSIS — I129 Hypertensive chronic kidney disease with stage 1 through stage 4 chronic kidney disease, or unspecified chronic kidney disease: Secondary | ICD-10-CM | POA: Diagnosis not present

## 2021-11-22 DIAGNOSIS — E875 Hyperkalemia: Secondary | ICD-10-CM | POA: Diagnosis not present

## 2021-11-22 DIAGNOSIS — E213 Hyperparathyroidism, unspecified: Secondary | ICD-10-CM | POA: Diagnosis not present

## 2022-01-01 ENCOUNTER — Telehealth: Payer: Self-pay | Admitting: Family Medicine

## 2022-01-01 NOTE — Telephone Encounter (Signed)
Called patient to schedule AWV, if patient calls back please let me know so I can help schedule it while here or on the phone.   Thanks!

## 2022-04-06 ENCOUNTER — Ambulatory Visit: Payer: Medicare HMO

## 2022-04-16 IMAGING — US US RENAL
1 series · 14 of 25 positions shown · non-contrast
Comparison: None.

CLINICAL DATA: 74-year-old female with chronic kidney disease

EXAM:
RENAL / URINARY TRACT ULTRASOUND COMPLETE

[Series 1: us renal · 0.17mm/px · 14 of 31 slices shown]
[im 1/31]
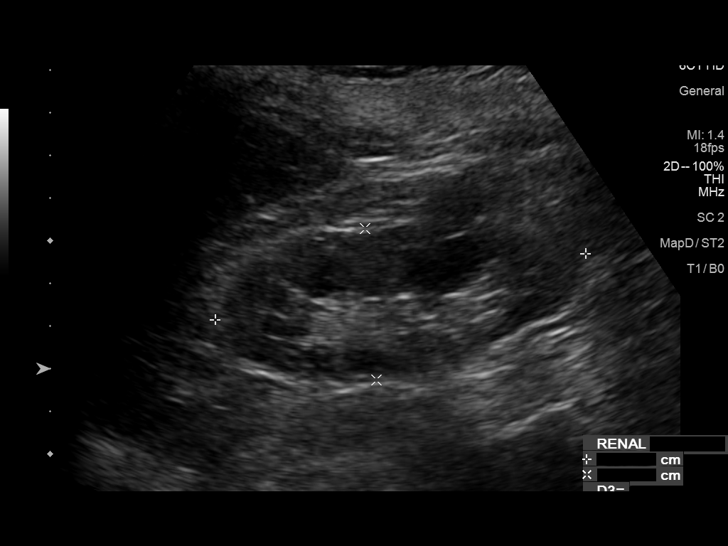
[im 3/31]
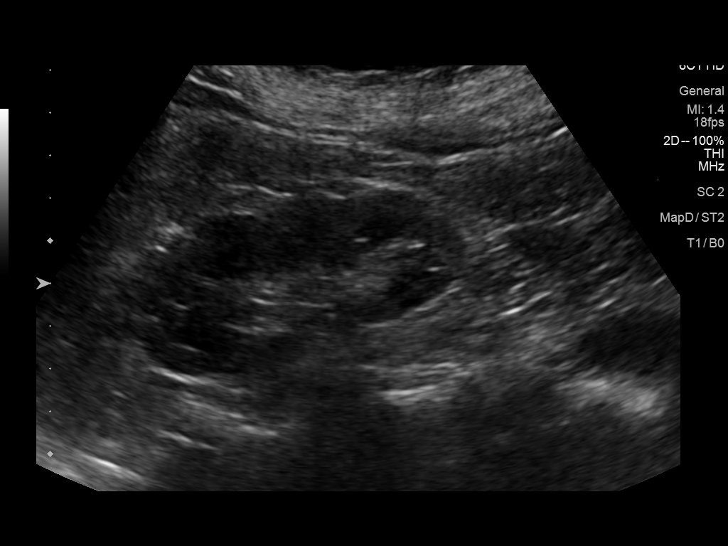
[im 6/31]
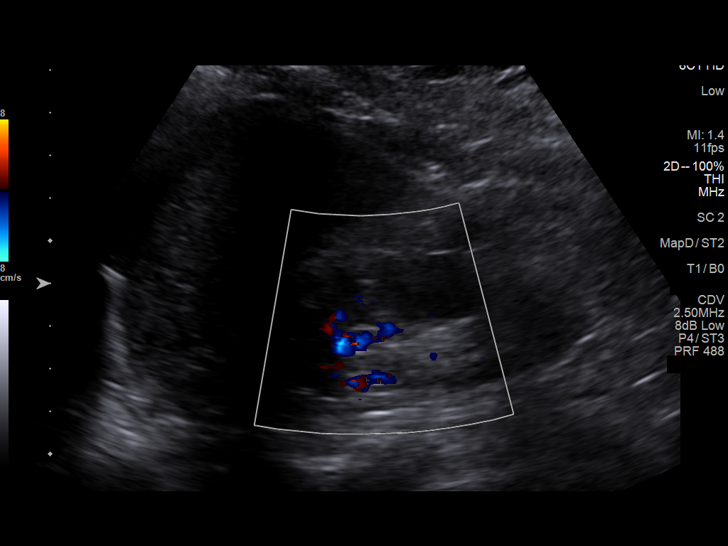
[im 8/31]
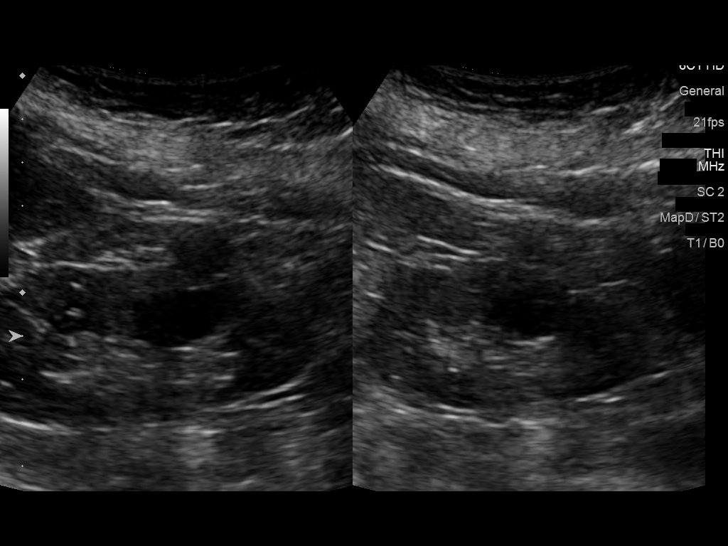
[im 11/31]
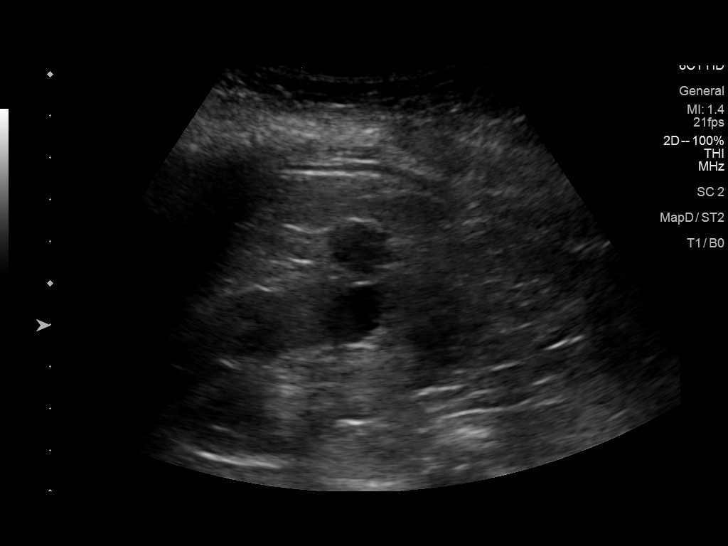
[im 12/31]
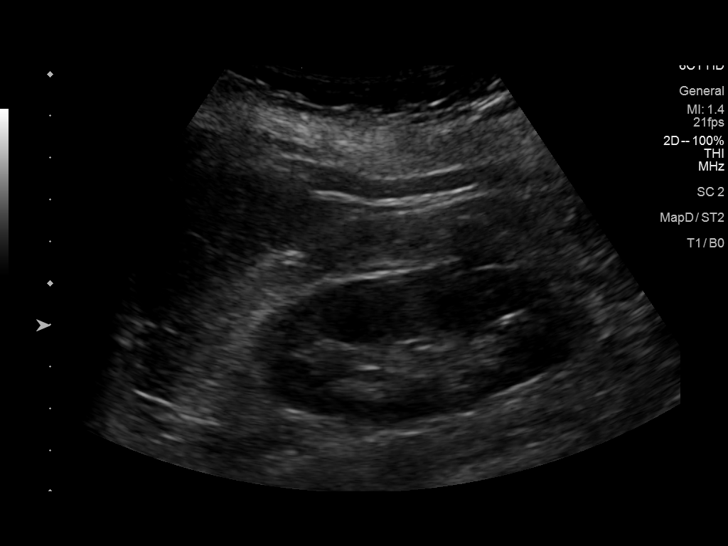
[im 14/31]
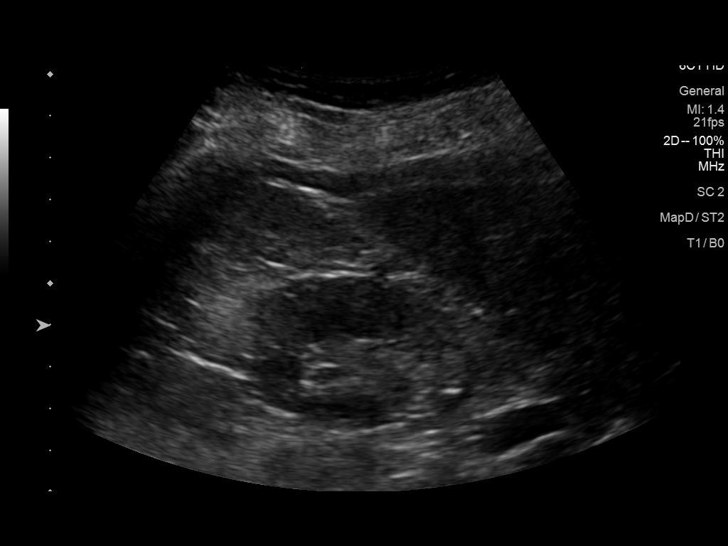
[im 17/31]
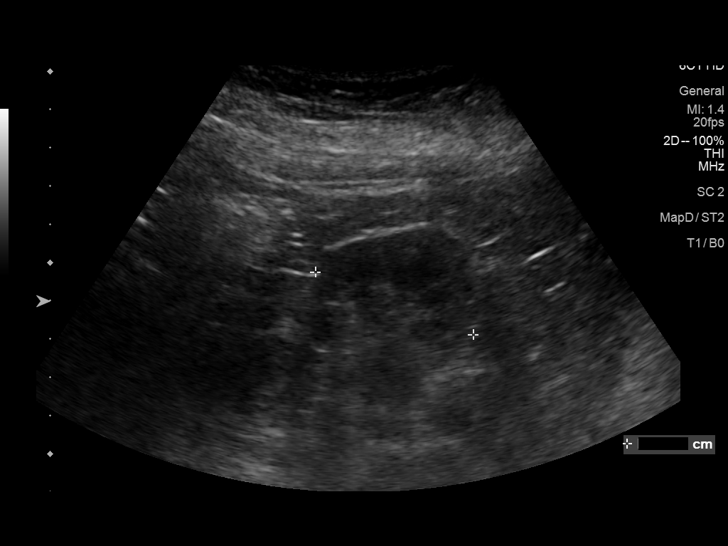
[im 19/31]
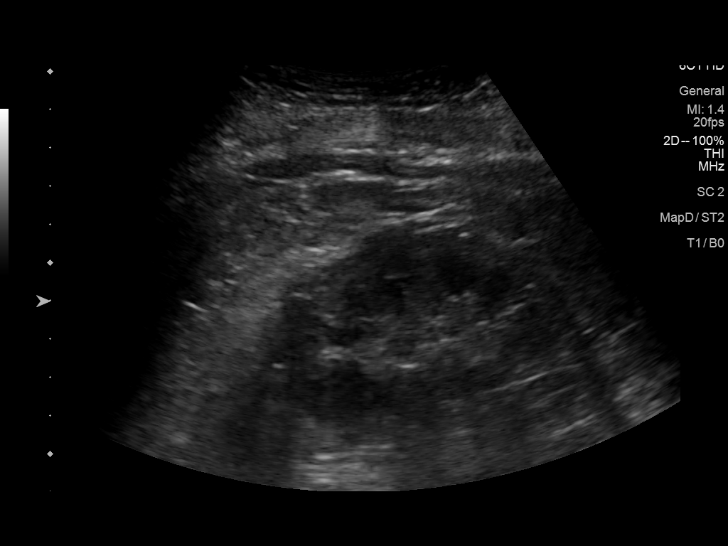
[im 21/31]
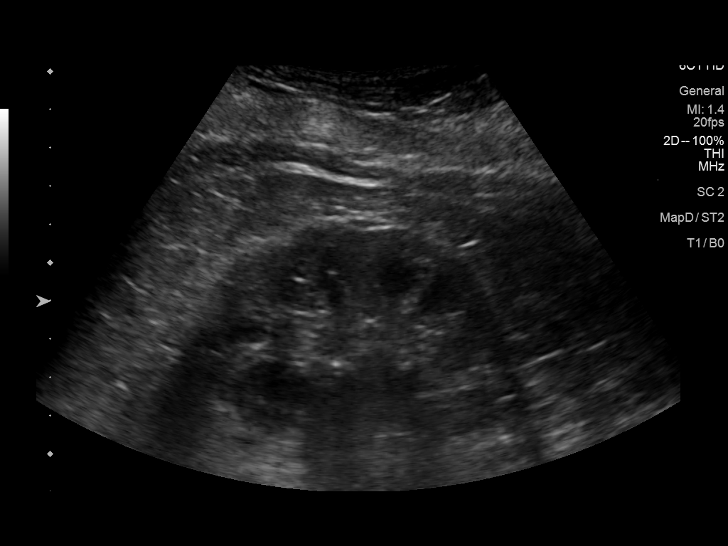
[im 23/31]
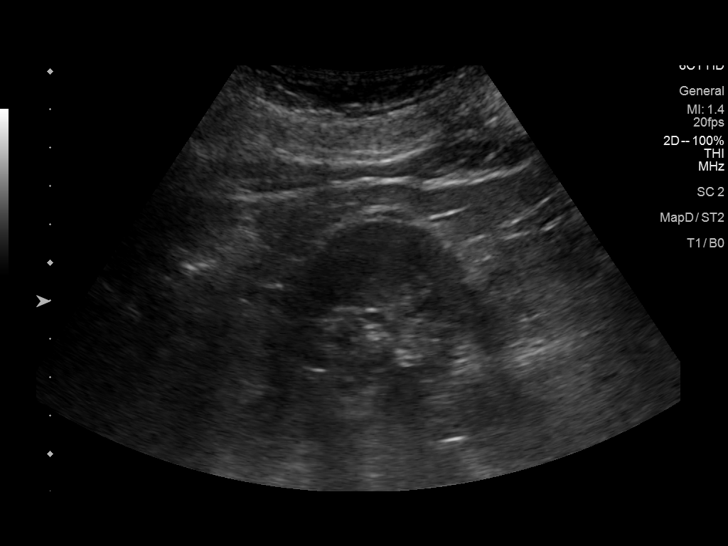
[im 26/31]
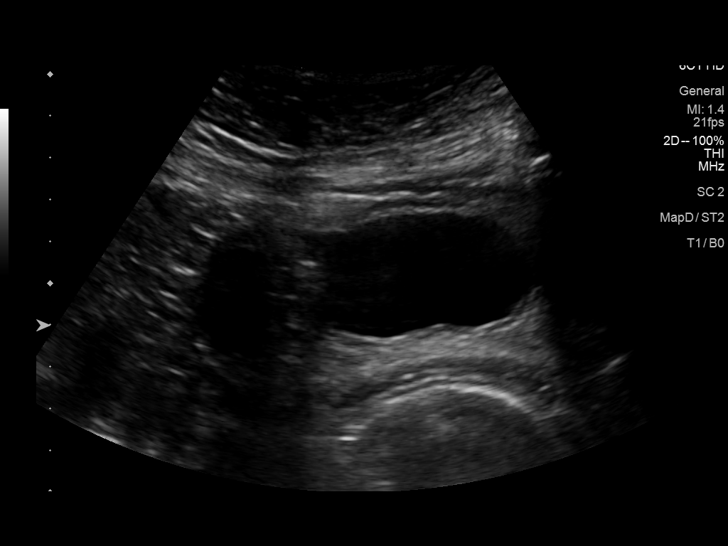
[im 28/31]
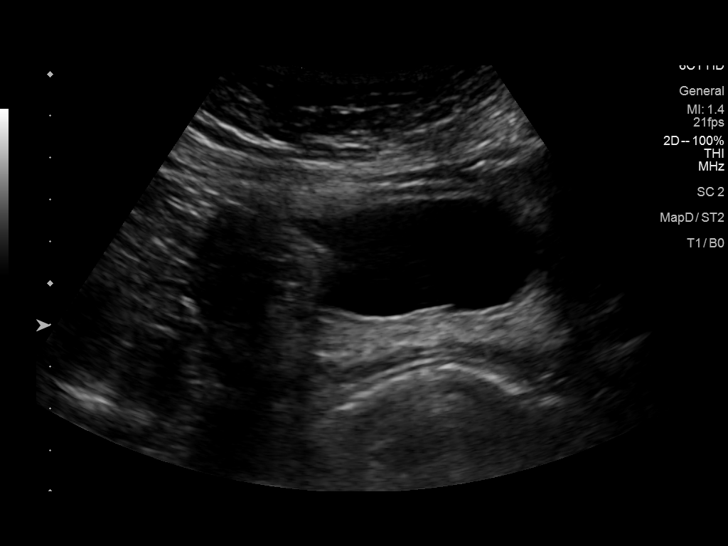
[im 31/31]
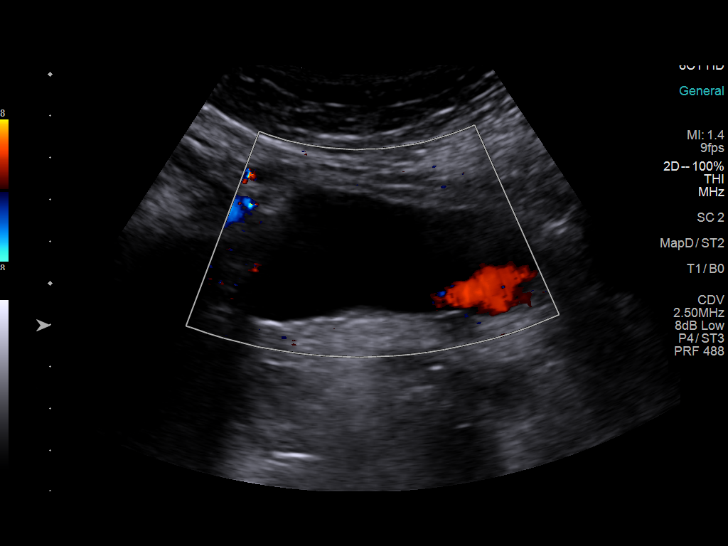

[14 of 25 positions shown; findings below may reference images not displayed]

FINDINGS: Right Kidney:

Length: 8.8 cm x 3.6 cm x 4.0 cm, 65 cc. No hydronephrosis. Mild
cortical thinning, with relative increased echogenicity of the right
renal parenchyma. Cystic lesion in the interpolar right kidney
cm x 1.4 cm x 1.8 cm. This demonstrates through transmission no
internal complexity. Additional hypoechoic lesion without internal
complexity on the lateral cortex, exophytic, 1.4 cm x 1.3 cm x
cm.

Left Kidney:

Length: 7.6 cm x 5.0 cm x 4.4 cm, 90 cc. No hydronephrosis. Renal
cortical thinning with mild increased echogenicity.

Bladder:

Appears normal for degree of bladder distention.
IMPRESSION: No evidence of hydronephrosis.

Bilateral renal cortical thinning and relative increased
echogenicity compatible with medical renal disease.

Right-sided renal cysts

## 2022-04-27 DIAGNOSIS — N1832 Chronic kidney disease, stage 3b: Secondary | ICD-10-CM | POA: Diagnosis not present

## 2022-05-02 DIAGNOSIS — I129 Hypertensive chronic kidney disease with stage 1 through stage 4 chronic kidney disease, or unspecified chronic kidney disease: Secondary | ICD-10-CM | POA: Diagnosis not present

## 2022-05-02 DIAGNOSIS — E213 Hyperparathyroidism, unspecified: Secondary | ICD-10-CM | POA: Diagnosis not present

## 2022-05-02 DIAGNOSIS — E875 Hyperkalemia: Secondary | ICD-10-CM | POA: Diagnosis not present

## 2022-05-02 DIAGNOSIS — D509 Iron deficiency anemia, unspecified: Secondary | ICD-10-CM | POA: Diagnosis not present

## 2022-05-02 DIAGNOSIS — N1832 Chronic kidney disease, stage 3b: Secondary | ICD-10-CM | POA: Diagnosis not present

## 2022-05-16 ENCOUNTER — Other Ambulatory Visit: Payer: Self-pay

## 2022-05-16 MED ORDER — SIMVASTATIN 20 MG PO TABS: 20.0000 mg | ORAL_TABLET | Freq: Every day | ORAL | 1 refills | Status: DC

## 2022-07-06 ENCOUNTER — Other Ambulatory Visit: Payer: Self-pay | Admitting: Family Medicine

## 2022-07-13 ENCOUNTER — Encounter: Payer: Self-pay | Admitting: Family Medicine

## 2022-07-13 ENCOUNTER — Ambulatory Visit (INDEPENDENT_AMBULATORY_CARE_PROVIDER_SITE_OTHER): Payer: Medicare HMO | Admitting: Family Medicine

## 2022-07-13 VITALS — BP 172/81 | HR 90 | Ht 63.0 in | Wt 200.0 lb

## 2022-07-13 DIAGNOSIS — R202 Paresthesia of skin: Secondary | ICD-10-CM | POA: Diagnosis not present

## 2022-07-13 DIAGNOSIS — D509 Iron deficiency anemia, unspecified: Secondary | ICD-10-CM

## 2022-07-13 DIAGNOSIS — N183 Chronic kidney disease, stage 3 unspecified: Secondary | ICD-10-CM

## 2022-07-13 DIAGNOSIS — Z23 Encounter for immunization: Secondary | ICD-10-CM | POA: Diagnosis not present

## 2022-07-13 DIAGNOSIS — E559 Vitamin D deficiency, unspecified: Secondary | ICD-10-CM | POA: Diagnosis not present

## 2022-07-13 DIAGNOSIS — E7800 Pure hypercholesterolemia, unspecified: Secondary | ICD-10-CM | POA: Diagnosis not present

## 2022-07-13 DIAGNOSIS — R200 Anesthesia of skin: Secondary | ICD-10-CM

## 2022-07-13 DIAGNOSIS — I10 Essential (primary) hypertension: Secondary | ICD-10-CM | POA: Diagnosis not present

## 2022-07-13 DIAGNOSIS — Z0000 Encounter for general adult medical examination without abnormal findings: Secondary | ICD-10-CM | POA: Insufficient documentation

## 2022-07-13 MED ORDER — ZOSTER VAC RECOMB ADJUVANTED 50 MCG/0.5ML IM SUSR: 0.5000 mL | Freq: Once | INTRAMUSCULAR | 0 refills | Status: AC

## 2022-07-13 MED ORDER — CHLORTHALIDONE 25 MG PO TABS: 25.0000 mg | ORAL_TABLET | Freq: Every day | ORAL | 3 refills | Status: DC

## 2022-07-13 MED ORDER — SODIUM POLYSTYRENE SULFONATE 15 GM/60ML PO SUSP: 30.0000 g | Freq: Every day | ORAL | 0 refills | Status: DC

## 2022-07-13 MED ORDER — ASPIRIN 81 MG PO TBEC: 81.0000 mg | DELAYED_RELEASE_TABLET | Freq: Every day | ORAL | 0 refills | Status: AC

## 2022-07-13 NOTE — Assessment & Plan Note (Signed)
CBC today. Patient taking OTC iron without adverse side effects.

## 2022-07-13 NOTE — Assessment & Plan Note (Signed)
-   Labs: CBC, CMP, vit. D, A1c, lipids, B12, folate - Meds refilled (except statin, will await lipid panel for possible adjustments) - DEXA ordered - printed Shingrix script

## 2022-07-13 NOTE — Progress Notes (Signed)
    SUBJECTIVE:   CHIEF COMPLAINT / HPI:   Annual physical  Wanda Vaughn is a pleasant 77 y.o. woman who presents for her annual physical exam.  Medications and allergies updated.  Vaccines due: Shingles, COVID booster, TDAP Screenings due: DEXA Pap: aged out Mammogram: aged out Colonoscopy: aged out Lung CT: n/a, non smoker DEXA: due   PERTINENT  PMH / PSH:  Patient Active Problem List   Diagnosis Date Noted   Well adult health check 07/13/2022   BMI 35.0-35.9,adult 08/05/2020   CKD (chronic kidney disease) 04/01/2020   Hyperkalemia 03/02/2020   COVID-19 vaccine administered 02/17/2020   Numbness and tingling of both feet 02/17/2020   Pure hypercholesterolemia 11/11/2017   Cold intolerance 11/11/2017   Iron deficiency anemia 11/11/2017   Vitamin D deficiency 11/11/2017   Essential hypertension 11/11/2017   Impairment of balance 11/11/2017    OBJECTIVE:   BP (!) 172/81   Pulse 90   Ht 5\' 3"  (1.6 m)   Wt 200 lb (90.7 kg)   SpO2 100%   BMI 35.43 kg/m    PHQ-9:     03/24/2021    9:49 AM 08/05/2020    8:44 AM 04/05/2020    8:37 AM  Depression screen PHQ 2/9  Decreased Interest 0 0 0  Down, Depressed, Hopeless 0 0 0  PHQ - 2 Score 0 0 0  Altered sleeping 0 0 0  Tired, decreased energy 0 0 0  Change in appetite 0 0 0  Feeling bad or failure about yourself  0 0 0  Trouble concentrating 0 0 0  Moving slowly or fidgety/restless 0 0 0  Suicidal thoughts 0 0 0  PHQ-9 Score 0 0 0  Difficult doing work/chores Not difficult at all Not difficult at all     Physical Exam General: Awake, alert, oriented Neck: Thyroid unremarkable to palpation Cardiovascular: Regular rate and rhythm, S1 and S2 present, no murmurs auscultated Respiratory: Lung fields clear to auscultation bilaterally Extremities: No bilateral lower extremity edema, palpable pedal and pretibial pulses bilaterally  ASSESSMENT/PLAN:   Vitamin D deficiency Patient currently taking OTC vitamin D, cannot  recall dose. Will check Vit D level today. Ordered DEXA.   CKD (chronic kidney disease) BMP today for Cr recheck.   Pure hypercholesterolemia Lipid panel today. Tolerating statin well.   Iron deficiency anemia CBC today. Patient taking OTC iron without adverse side effects.   Numbness and tingling of both feet Temporality at bedtime sounds more like restless leg syndrome. Will recheck folate, B12, and A1c. If normal, could pursue RLS medication. Will call to discuss once results.   Well adult health check - Labs: CBC, CMP, vit. D, A1c, lipids, B12, folate - Meds refilled (except statin, will await lipid panel for possible adjustments) - DEXA ordered - printed Shingrix script   Fayette Pho, MD [facility]

## 2022-07-13 NOTE — Assessment & Plan Note (Signed)
Temporality at bedtime sounds more like restless leg syndrome. Will recheck folate, B12, and A1c. If normal, could pursue RLS medication. Will call to discuss once results.

## 2022-07-13 NOTE — Assessment & Plan Note (Signed)
BMP today for Cr recheck.

## 2022-07-13 NOTE — Assessment & Plan Note (Signed)
Lipid panel today. Tolerating statin well.

## 2022-07-13 NOTE — Patient Instructions (Signed)
It was wonderful to see you today. Thank you for allowing me to be a part of your care. Below is a short summary of what we discussed at your visit today:  Primary care doctor graduating soon! I am graduating June 28th. You will be assigned a new PCP.  Thank you for letting me care for you! It has been wonderful getting to know you.   Blood work  Today we drew blood to check in on your kidneys, liver, cholesterol, vitamin D, and anemia. If the results are normal, I will send you a letter or MyChart message. If the results are abnormal, I will give you a call.    Shingles Vaccine I have written a prescription for the shingles vaccine.  Please take this to your pharmacy to have this administered. Your insurance prefers you get this specific vaccine at the pharmacy instead of in clinic.   Medicare Annual Wellness Exam Your chart indicates that you are due for your Medicare annual wellness exam.  Your insurance likes [place] to do one of these every year.  This is a nurse only visit that takes about 30 minutes to an hour.  It can be done either in person or virtually.  This is an in-depth visit that focuses on preventative care and keeping you healthy.  Somebody from our clinic will be calling you soon to get this scheduled.  Vaccines Today you received the COVID booster. You may experience some residual soreness at the injection site.  Gentle stretches and regular use of that arm will help speed up your recovery.  As the vaccines are giving your immune system a "practice run" against specific infections, you may feel a little under the weather for the next several days.  We recommend rest as needed and hydrating.  Please obtain your Shingles vaccine and TDaP booster at your pharmacy.   Bone density screening Please book the DEXA scan at the [facility] imaging breast center at your convenience.  This is to screen for osteoporosis and osteopenia.  If you have obvious conditions, there are medications we  can do to help slow bone loss.     Please bring all of your medications to every appointment!  If you have any questions or concerns, please do not hesitate to contact us via phone or MyChart message.   Fayette Pho, MD

## 2022-07-13 NOTE — Assessment & Plan Note (Addendum)
Patient currently taking OTC vitamin D, cannot recall dose. Will check Vit D level today. Ordered DEXA.

## 2022-07-14 LAB — CBC
Hematocrit: 32.6 % — ABNORMAL LOW (ref 34.0–46.6)
Hemoglobin: 10.1 g/dL — ABNORMAL LOW (ref 11.1–15.9)
MCH: 27.7 pg (ref 26.6–33.0)
MCHC: 31 g/dL — ABNORMAL LOW (ref 31.5–35.7)
MCV: 90 fL (ref 79–97)
Platelets: 213 10*3/uL (ref 150–450)
RBC: 3.64 x10E6/uL — ABNORMAL LOW (ref 3.77–5.28)
RDW: 15.8 % — ABNORMAL HIGH (ref 11.7–15.4)
WBC: 6.7 10*3/uL (ref 3.4–10.8)

## 2022-07-14 LAB — COMPREHENSIVE METABOLIC PANEL
ALT: 11 IU/L (ref 0–32)
AST: 20 IU/L (ref 0–40)
Albumin/Globulin Ratio: 1.6 (ref 1.2–2.2)
Albumin: 4.2 g/dL (ref 3.8–4.8)
Alkaline Phosphatase: 74 IU/L (ref 44–121)
BUN/Creatinine Ratio: 15 (ref 12–28)
BUN: 24 mg/dL (ref 8–27)
Bilirubin Total: 0.4 mg/dL (ref 0.0–1.2)
CO2: 21 mmol/L (ref 20–29)
Calcium: 9.9 mg/dL (ref 8.7–10.3)
Chloride: 105 mmol/L (ref 96–106)
Creatinine, Ser: 1.61 mg/dL — ABNORMAL HIGH (ref 0.57–1.00)
Globulin, Total: 2.6 g/dL (ref 1.5–4.5)
Glucose: 90 mg/dL (ref 70–99)
Potassium: 5.1 mmol/L (ref 3.5–5.2)
Sodium: 140 mmol/L (ref 134–144)
Total Protein: 6.8 g/dL (ref 6.0–8.5)
eGFR: 33 mL/min/{1.73_m2} — ABNORMAL LOW (ref >59)

## 2022-07-14 LAB — VITAMIN D 25 HYDROXY (VIT D DEFICIENCY, FRACTURES): Vit D, 25-Hydroxy: 33.7 ng/mL (ref 30.0–100.0)

## 2022-07-14 LAB — LIPID PANEL
Chol/HDL Ratio: 2.3 ratio (ref 0.0–4.4)
Cholesterol, Total: 148 mg/dL (ref 100–199)
HDL: 63 mg/dL (ref >39)
LDL Chol Calc (NIH): 72 mg/dL (ref 0–99)
Triglycerides: 64 mg/dL (ref 0–149)
VLDL Cholesterol Cal: 13 mg/dL (ref 5–40)

## 2022-07-14 LAB — FOLATE: Folate: 8.6 ng/mL (ref >3.0)

## 2022-07-14 LAB — HEMOGLOBIN A1C
Est. average glucose Bld gHb Est-mCnc: 117 mg/dL
Hgb A1c MFr Bld: 5.7 % — ABNORMAL HIGH (ref 4.8–5.6)

## 2022-07-14 LAB — VITAMIN B12: Vitamin B-12: 1242 pg/mL (ref 232–1245)

## 2022-07-15 ENCOUNTER — Other Ambulatory Visit: Payer: Self-pay | Admitting: Family Medicine

## 2022-07-15 DIAGNOSIS — D509 Iron deficiency anemia, unspecified: Secondary | ICD-10-CM

## 2022-07-15 NOTE — Progress Notes (Signed)
Add on iron panel to quantify iron deficiency anemia.   Fayette Pho, MD

## 2022-07-16 ENCOUNTER — Telehealth: Payer: Self-pay

## 2022-07-16 NOTE — Telephone Encounter (Signed)
Patient calls nurse line in regards to recent lab results.   She reports she does not use mychart and prefers a phone call.   I see PCP was planning to call her to discuss once all labs came back.   Will forward to PCP.

## 2022-07-17 LAB — IRON,TIBC AND FERRITIN PANEL
Ferritin: 165 ng/mL — ABNORMAL HIGH (ref 15–150)
Iron Saturation: 42 % (ref 15–55)
Iron: 107 ug/dL (ref 27–139)
Total Iron Binding Capacity: 254 ug/dL (ref 250–450)
UIBC: 147 ug/dL (ref 118–369)

## 2022-07-17 LAB — SPECIMEN STATUS REPORT

## 2022-07-19 ENCOUNTER — Telehealth: Payer: Self-pay | Admitting: Family Medicine

## 2022-07-19 DIAGNOSIS — D509 Iron deficiency anemia, unspecified: Secondary | ICD-10-CM

## 2022-07-19 DIAGNOSIS — G2581 Restless legs syndrome: Secondary | ICD-10-CM

## 2022-07-19 MED ORDER — GABAPENTIN 100 MG PO CAPS: 100.0000 mg | ORAL_CAPSULE | Freq: Every evening | ORAL | 2 refills | Status: AC | PRN

## 2022-07-19 NOTE — Telephone Encounter (Signed)
Spoke with patient regarding lab results.   Iron normal but anemia persistent. Patient reports only taking iron supplementation for the last month or so. Plan to continue iron supp and recheck labs in one month. Order placed.   Also discussed A1c. Recommended limiting starchy carbs in favor of more whole fresh fruits, veggies, and lean meats.   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 Will start low dose gabapentin 100 mg QHS PRN for symptom control. Discussed possible side effects, but reassured patient these were unlikely given dose and frequency.   Fayette Pho, MD

## 2022-07-19 NOTE — Telephone Encounter (Signed)
Called patient once all labs back. See phone note.   Fayette Pho, MD

## 2022-07-26 ENCOUNTER — Telehealth: Payer: Self-pay | Admitting: Student

## 2022-07-26 NOTE — Telephone Encounter (Signed)
After hours call:  Pt calls after hrs line to discuss newly prescribed medication, Wanda Vaughn, she picked up from pharmacy. She is unsure what it is for. It was prescribed by PCP, Dr. Larita Fife. I discussed with pt this medication lowers potassium.  She states she is currently taking Lokelma 5 mg 3x/week as prescribed by her nephrologist.   K was on higher end of normal but WNL at 5.1 on 07/12/2021.   I advised pt to not take the Wanda Vaughn as Dr. Larita Fife may have not known about the [facility] as we cannot see records from [facility]. I told pt I will forward this note to Dr. Larita Fife so that she will be informed and can further advise pt if she is wanting her to switch from lokelma to Wanda Vaughn. Pt voiced understanding and will continue to take her lokelma 3x/week and will not take the Wanda Vaughn at this time.

## 2022-07-27 NOTE — Addendum Note (Signed)
Addended by: Valetta Close on: 07/27/2022 02:19 PM   Modules accepted: Orders

## 2022-07-27 NOTE — Telephone Encounter (Signed)
Returned call to patient. There appears to have been a misunderstanding on my end. At our last appointment, patient requested several refills. I thought she was taking the kayexalate and not the lokelma. Both meds were on her list. I went back and reviewed records from [facility], which only reference Lokelma MWF.   I relayed this to the patient and apologized for the mix up. She should ONLY take the [facility] as instructed by her nephrologist. I have corrected her med list in the chart.    Fayette Pho, MD

## 2022-07-30 ENCOUNTER — Other Ambulatory Visit: Payer: Self-pay | Admitting: Family Medicine

## 2022-10-26 DIAGNOSIS — N1832 Chronic kidney disease, stage 3b: Secondary | ICD-10-CM | POA: Diagnosis not present

## 2022-11-02 DIAGNOSIS — E559 Vitamin D deficiency, unspecified: Secondary | ICD-10-CM | POA: Diagnosis not present

## 2022-11-02 DIAGNOSIS — N1832 Chronic kidney disease, stage 3b: Secondary | ICD-10-CM | POA: Diagnosis not present

## 2022-11-02 DIAGNOSIS — D509 Iron deficiency anemia, unspecified: Secondary | ICD-10-CM | POA: Diagnosis not present

## 2022-11-02 DIAGNOSIS — E213 Hyperparathyroidism, unspecified: Secondary | ICD-10-CM | POA: Diagnosis not present

## 2022-11-02 DIAGNOSIS — E875 Hyperkalemia: Secondary | ICD-10-CM | POA: Diagnosis not present

## 2022-11-02 DIAGNOSIS — I129 Hypertensive chronic kidney disease with stage 1 through stage 4 chronic kidney disease, or unspecified chronic kidney disease: Secondary | ICD-10-CM | POA: Diagnosis not present

## 2022-11-27 ENCOUNTER — Ambulatory Visit: Payer: Medicare HMO

## 2022-11-27 DIAGNOSIS — Z0000 Encounter for general adult medical examination without abnormal findings: Secondary | ICD-10-CM | POA: Diagnosis not present

## 2022-11-27 NOTE — Patient Instructions (Signed)
Wanda Vaughn , Thank you for taking time to come for your Medicare Wellness Visit. I appreciate your ongoing commitment to your health goals. Please review the following plan we discussed and let me know if I can assist you in the future.   Screening recommendations/referrals: Colonoscopy: no longer required Mammogram:  Bone Density: Education provided Recommended yearly ophthalmology/optometry visit for glaucoma screening and checkup Recommended yearly dental visit for hygiene and checkup  Vaccinations: Influenza vaccine: Education provided Pneumococcal vaccine: Education provided Tdap vaccine: Education provided Shingles vaccine: Education provided      Preventive Care 65 Years and Older, Female Preventive care refers to lifestyle choices and visits with your health care provider that can promote health and wellness. What does preventive care include? A yearly physical exam. This is also called an annual well check. Dental exams once or twice a year. Routine eye exams. Ask your health care provider how often you should have your eyes checked. Personal lifestyle choices, including: Daily care of your teeth and gums. Regular physical activity. Eating a healthy diet. Avoiding tobacco and drug use. Limiting alcohol use. Practicing safe sex. Taking low-dose aspirin every day. Taking vitamin and mineral supplements as recommended by your health care provider. What happens during an annual well check? The services and screenings done by your health care provider during your annual well check will depend on your age, overall health, lifestyle risk factors, and family history of disease. Counseling  Your health care provider may ask you questions about your: Alcohol use. Tobacco use. Drug use. Emotional well-being. Home and relationship well-being. Sexual activity. Eating habits. History of falls. Memory and ability to understand (cognition). Work and work  Astronomer. Reproductive health. Screening  You may have the following tests or measurements: Height, weight, and BMI. Blood pressure. Lipid and cholesterol levels. These may be checked every 5 years, or more frequently if you are over 51 years old. Skin check. Lung cancer screening. You may have this screening every year starting at age 55 if you have a 30-pack-year history of smoking and currently smoke or have quit within the past 15 years. Fecal occult blood test (FOBT) of the stool. You may have this test every year starting at age 50. Flexible sigmoidoscopy or colonoscopy. You may have a sigmoidoscopy every 5 years or a colonoscopy every 10 years starting at age 78. Hepatitis C blood test. Hepatitis B blood test. Sexually transmitted disease (STD) testing. Diabetes screening. This is done by checking your blood sugar (glucose) after you have not eaten for a while (fasting). You may have this done every 1-3 years. Bone density scan. This is done to screen for osteoporosis. You may have this done starting at age 65. Mammogram. This may be done every 1-2 years. Talk to your health care provider about how often you should have regular mammograms. Talk with your health care provider about your test results, treatment options, and if necessary, the need for more tests. Vaccines  Your health care provider may recommend certain vaccines, such as: Influenza vaccine. This is recommended every year. Tetanus, diphtheria, and acellular pertussis (Tdap, Td) vaccine. You may need a Td booster every 10 years. Zoster vaccine. You may need this after age 87. Pneumococcal 13-valent conjugate (PCV13) vaccine. One dose is recommended after age 14. Pneumococcal polysaccharide (PPSV23) vaccine. One dose is recommended after age 52. Talk to your health care provider about which screenings and vaccines you need and how often you need them. This information is not intended to replace advice  given to you by  your health care provider. Make sure you discuss any questions you have with your health care provider. Document Released: 03/11/2015 Document Revised: 11/02/2015 Document Reviewed: 12/14/2014 Elsevier Interactive Patient Education  2017 [employer].  Fall Prevention in the Home Falls can cause injuries. They can happen to people of all ages. There are many things you can do to make your home safe and to help prevent falls. What can I do on the outside of my home? Regularly fix the edges of walkways and driveways and fix any cracks. Remove anything that might make you trip as you walk through a door, such as a raised step or threshold. Trim any bushes or trees on the path to your home. Use bright outdoor lighting. Clear any walking paths of anything that might make someone trip, such as rocks or tools. Regularly check to see if handrails are loose or broken. Make sure that both sides of any steps have handrails. Any raised decks and porches should have guardrails on the edges. Have any leaves, snow, or ice cleared regularly. Use sand or salt on walking paths during winter. Clean up any spills in your garage right away. This includes oil or grease spills. What can I do in the bathroom? Use night lights. Install grab bars by the toilet and in the tub and shower. Do not use towel bars as grab bars. Use non-skid mats or decals in the tub or shower. If you need to sit down in the shower, use a plastic, non-slip stool. Keep the floor dry. Clean up any water that spills on the floor as soon as it happens. Remove soap buildup in the tub or shower regularly. Attach bath mats securely with double-sided non-slip rug tape. Do not have throw rugs and other things on the floor that can make you trip. What can I do in the bedroom? Use night lights. Make sure that you have a light by your bed that is easy to reach. Do not use any sheets or blankets that are too big for your bed. They should not hang  down onto the floor. Have a firm chair that has side arms. You can use this for support while you get dressed. Do not have throw rugs and other things on the floor that can make you trip. What can I do in the kitchen? Clean up any spills right away. Avoid walking on wet floors. Keep items that you use a lot in easy-to-reach places. If you need to reach something above you, use a strong step stool that has a grab bar. Keep electrical cords out of the way. Do not use floor polish or wax that makes floors slippery. If you must use wax, use non-skid floor wax. Do not have throw rugs and other things on the floor that can make you trip. What can I do with my stairs? Do not leave any items on the stairs. Make sure that there are handrails on both sides of the stairs and use them. Fix handrails that are broken or loose. Make sure that handrails are as long as the stairways. Check any carpeting to make sure that it is firmly attached to the stairs. Fix any carpet that is loose or worn. Avoid having throw rugs at the top or bottom of the stairs. If you do have throw rugs, attach them to the floor with carpet tape. Make sure that you have a light switch at the top of the stairs and the bottom of  the stairs. If you do not have them, ask someone to add them for you. What else can I do to help prevent falls? Wear shoes that: Do not have high heels. Have rubber bottoms. Are comfortable and fit you well. Are closed at the toe. Do not wear sandals. If you use a stepladder: Make sure that it is fully opened. Do not climb a closed stepladder. Make sure that both sides of the stepladder are locked into place. Ask someone to hold it for you, if possible. Clearly mark and make sure that you can see: Any grab bars or handrails. First and last steps. Where the edge of each step is. Use tools that help you move around (mobility aids) if they are needed. These  include: Canes. Walkers. Scooters. Crutches. Turn on the lights when you go into a dark area. Replace any light bulbs as soon as they burn out. Set up your furniture so you have a clear path. Avoid moving your furniture around. If any of your floors are uneven, fix them. If there are any pets around you, be aware of where they are. Review your medicines with your doctor. Some medicines can make you feel dizzy. This can increase your chance of falling. Ask your doctor what other things that you can do to help prevent falls. This information is not intended to replace advice given to you by your health care provider. Make sure you discuss any questions you have with your health care provider. Document Released: 12/09/2008 Document Revised: 07/21/2015 Document Reviewed: 03/19/2014 Elsevier Interactive Patient Education  2017 [employer].

## 2022-11-27 NOTE — Progress Notes (Signed)
Subjective:   Wanda Vaughn is a 77 y.o. female who presents for an Initial Medicare Annual Wellness Visit.  Visit Complete: Virtual  I connected with  Wanda Vaughn on 11/27/22 by a audio enabled telemedicine application and verified that I am speaking with the correct person using two identifiers.  Patient Location: Home  Provider Location: Home Office  I discussed the limitations of evaluation and management by telemedicine. The patient expressed understanding and agreed to proceed.  Because this visit was a virtual/telehealth visit, some criteria may be missing or patient reported. Any vitals not documented were not able to be obtained and vitals that have been documented are patient reported.    Cardiac Risk Factors include: advanced age (>79men, >42 women);hypertension;obesity (BMI >30kg/m2);family history of premature cardiovascular disease     Objective:    There were no vitals filed for this visit. There is no height or weight on file to calculate BMI.     11/27/2022    9:40 AM 07/13/2022   10:07 AM 03/24/2021    9:50 AM 04/05/2020    8:31 AM 03/02/2020    8:45 AM 11/11/2017    8:27 AM  Advanced Directives  Does Patient Have a Medical Advance Directive? No No No No No No  Would patient like information on creating a medical advance directive? No - Patient declined No - Patient declined No - Patient declined No - Patient declined No - Patient declined No - Patient declined    Current Medications (verified) Outpatient Encounter Medications as of 11/27/2022  Medication Sig   aspirin EC 81 MG tablet Take 1 tablet (81 mg total) by mouth daily. Swallow whole.   chlorthalidone (HYGROTON) 25 MG tablet Take 1 tablet (25 mg total) by mouth daily.   Cholecalciferol (VITAMIN D3) 125 MCG (5000 UT) TABS Take by mouth.   ferrous sulfate 325 (65 FE) MG EC tablet Take 325 mg by mouth 3 (three) times daily with meals.   simvastatin (ZOCOR) 20 MG tablet Take 1 tablet (20 mg total) by  mouth daily at 6 PM.   Sodium Zirconium Cyclosilicate (LOKELMA PO) Take by mouth every Monday, Wednesday, and Friday.   vitamin B-12 (CYANOCOBALAMIN) 100 MCG tablet Take 100 mcg by mouth daily.   gabapentin (NEURONTIN) 100 MG capsule Take 1 capsule (100 mg total) by mouth at bedtime as needed. (Patient not taking: Reported on 11/27/2022)   No facility-administered encounter medications on file as of 11/27/2022.    Allergies (verified) Penicillins   History: Past Medical History:  Diagnosis Date   Hyperlipidemia    Hypertension    Stroke [facility]) 2013   History reviewed. No pertinent surgical history. Family History  Problem Relation Age of Onset   Heart disease Mother    Hypertension Mother    Stroke Mother    Social History   Socioeconomic History   Marital status: Single    Spouse name: Not on file   Number of children: Not on file   Years of education: Not on file   Highest education level: GED or equivalent  Occupational History   Not on file  Tobacco Use   Smoking status: Never   Smokeless tobacco: Never  Substance and Sexual Activity   Alcohol use: Never   Drug use: Never   Sexual activity: Not Currently  Other Topics Concern   Not on file  Social History Narrative   Not on file   Social Determinants of Health   Financial Resource Strain: Low Risk  (  11/27/2022)   Overall Financial Resource Strain (CARDIA)    Difficulty of Paying Living Expenses: Not hard at all  Food Insecurity: No Food Insecurity (11/27/2022)   Hunger Vital Sign    Worried About Running Out of Food in the Last Year: Never true    Ran Out of Food in the Last Year: Never true  Transportation Needs: No Transportation Needs (11/27/2022)   PRAPARE - Administrator, Civil Service (Medical): No    Lack of Transportation (Non-Medical): No  Physical Activity: Insufficiently Active (11/27/2022)   Exercise Vital Sign    Days of Exercise per Week: 1 day    Minutes of Exercise per Session:  20 min  Stress: No Stress Concern Present (11/27/2022)   [employer] of Occupational Health - Occupational Stress Questionnaire    Feeling of Stress : Not at all  Social Connections: Moderately Isolated (11/27/2022)   Social Connection and Isolation Panel [NHANES]    Frequency of Communication with Friends and Family: More than three times a week    Frequency of Social Gatherings with Friends and Family: Three times a week    Attends Religious Services: More than 4 times per year    Active Member of Clubs or Organizations: No    Attends Banker Meetings: Never    Marital Status: Divorced    Tobacco Counseling Counseling given: Not Answered   Clinical Intake:  Pre-visit preparation completed: Yes  Pain : No/denies pain     Diabetes: No  How often do you need to have someone help you when you read instructions, pamphlets, or other written materials from your doctor or pharmacy?: 1 - Never  Interpreter Needed?: No  Information entered by :: Remi Haggard LPN   Activities of Daily Living    11/27/2022    9:35 AM  In your present state of health, do you have any difficulty performing the following activities:  Hearing? 0  Vision? 0  Difficulty concentrating or making decisions? 0  Dressing or bathing? 0  Doing errands, shopping? 0  Preparing Food and eating ? N  Using the Toilet? N  In the past six months, have you accidently leaked urine? N  Do you have problems with loss of bowel control? N  Managing your Medications? N  Managing your Finances? N  Housekeeping or managing your Housekeeping? N    Patient Care Team: Glendale Chard, DO as PCP - General (Family Medicine)  Indicate any recent Medical Services you may have received from other than [facility] providers in the past year (date may be approximate).     Assessment:   This is a routine wellness examination for [facility].  Hearing/Vision screen Hearing Screening - Comments:: No trouble  hearing Vision Screening - Comments:: Not up to date    Goals Addressed             This Visit's Progress    Increase physical activity         Depression Screen    11/27/2022    9:43 AM 07/13/2022   10:11 AM 03/24/2021    9:49 AM 08/05/2020    8:44 AM 04/05/2020    8:37 AM 04/01/2020    9:19 AM 03/02/2020    9:36 AM  PHQ 2/9 Scores  PHQ - 2 Score 0  0 0 0 0 0  PHQ- 9 Score   0 0 0 1 0  Exception Documentation  Patient refusal         Fall Risk  11/27/2022    9:35 AM 07/13/2022   10:08 AM 03/24/2021    9:51 AM 04/05/2020    8:32 AM 03/02/2020    9:36 AM  Fall Risk   Falls in the past year? 0 0 0 0 0  Number falls in past yr: 0 0 0 0 0  Injury with Fall? 0 0   0  Follow up Falls evaluation completed;Education provided;Falls prevention discussed        MEDICARE RISK AT HOME: Medicare Risk at Home Any stairs in or around the home?: Yes If so, are there any without handrails?: No Home free of loose throw rugs in walkways, pet beds, electrical cords, etc?: Yes Adequate lighting in your home to reduce risk of falls?: Yes Life alert?: No Use of a cane, walker or w/c?: Yes Grab bars in the bathroom?: No Shower chair or bench in shower?: No Elevated toilet seat or a handicapped toilet?: No  TIMED UP AND GO:  Was the test performed? No    Cognitive Function:        11/27/2022    9:43 AM  6CIT Screen  What Year? 0 points  What month? 0 points  What time? 0 points  Count back from 20 0 points  Months in reverse 0 points  Repeat phrase 2 points  Total Score 2 points    Immunizations Immunization History  Administered Date(s) Administered   Fluad Quad(high Dose 65+) 12/01/2018, 02/17/2020, 03/24/2021   Influenza,inj,Quad PF,6+ Mos 11/11/2017   PFIZER(Purple Top)SARS-COV-2 Vaccination 05/15/2019, 06/09/2019, 02/17/2020   Pfizer(Comirnaty)Fall Seasonal Vaccine 12 years and older 07/13/2022   Pneumococcal Conjugate-13 09/02/2018   Pneumococcal Polysaccharide-23  04/01/2020    TDAP status: Due, Education has been provided regarding the importance of this vaccine. Advised may receive this vaccine at local pharmacy or Health Dept. Aware to provide a copy of the vaccination record if obtained from local pharmacy or Health Dept. Verbalized acceptance and understanding.  Flu Vaccine status: Due, Education has been provided regarding the importance of this vaccine. Advised may receive this vaccine at local pharmacy or Health Dept. Aware to provide a copy of the vaccination record if obtained from local pharmacy or Health Dept. Verbalized acceptance and understanding.  Pneumococcal vaccine status: Up to date  Covid-19 vaccine status: Information provided on how to obtain vaccines.   Qualifies for Shingles Vaccine? Yes   Zostavax completed No   Shingrix Completed?: No.    Education has been provided regarding the importance of this vaccine. Patient has been advised to call insurance company to determine out of pocket expense if they have not yet received this vaccine. Advised may also receive vaccine at local pharmacy or Health Dept. Verbalized acceptance and understanding.  Screening Tests Health Maintenance  Topic Date Due   DTaP/Tdap/Td (1 - Tdap) Never done   Zoster Vaccines- Shingrix (1 of 2) Never done   DEXA SCAN  Never done   COVID-19 Vaccine (5 - 2023-24 season) 11/13/2022   INFLUENZA VACCINE  05/27/2023 (Originally 09/27/2022)   Medicare Annual Wellness (AWV)  11/27/2023   Pneumonia Vaccine 31+ Years old  Completed   Hepatitis C Screening  Completed   HPV VACCINES  Aged Out    Health Maintenance  Health Maintenance Due  Topic Date Due   DTaP/Tdap/Td (1 - Tdap) Never done   Zoster Vaccines- Shingrix (1 of 2) Never done   DEXA SCAN  Never done   COVID-19 Vaccine (5 - 2023-24 season) 11/13/2022    Colorectal cancer screening:  No longer required.   Mammogram status: No longer required due to age.  Bone Density   Patient given number for  [facility]  order was already placed  Lung Cancer Screening: (Low Dose CT Chest recommended if Age 77-80 years, 20 pack-year currently smoking OR have quit w/in 15years.) does not qualify.   Lung Cancer Screening Referral:   Additional Screening:  Hepatitis C Screening: does not qualify; Completed 2022  Vision Screening: Recommended annual ophthalmology exams for early detection of glaucoma and other disorders of the eye. Is the patient up to date with their annual eye exam?  No  Who is the provider or what is the name of the office in which the patient attends annual eye exams? Education provided If pt is not established with a provider, would they like to be referred to a provider to establish care? No .   Dental Screening: Recommended annual dental exams for proper oral hygiene    Community Resource Referral / Chronic Care Management: CRR required this visit?  No   CCM required this visit?  No     Plan:     I have personally reviewed and noted the following in the patient's chart:   Medical and social history Use of alcohol, tobacco or illicit drugs  Current medications and supplements including opioid prescriptions. Patient is not currently taking opioid prescriptions. Functional ability and status Nutritional status Physical activity Advanced directives List of other physicians Hospitalizations, surgeries, and ER visits in previous 12 months Vitals Screenings to include cognitive, depression, and falls Referrals and appointments  In addition, I have reviewed and discussed with patient certain preventive protocols, quality metrics, and best practice recommendations. A written personalized care plan for preventive services as well as general preventive health recommendations were provided to patient.     Remi Haggard, LPN   [id number]/02/[id number]   After Visit Summary: (MyChart) Due to this being a telephonic visit, the after visit summary with patients personalized  plan was offered to patient via MyChart   Nurse Notes:

## 2023-04-23 DIAGNOSIS — D509 Iron deficiency anemia, unspecified: Secondary | ICD-10-CM | POA: Diagnosis not present

## 2023-04-24 DIAGNOSIS — N1832 Chronic kidney disease, stage 3b: Secondary | ICD-10-CM | POA: Diagnosis not present

## 2023-04-24 LAB — IRON,TIBC AND FERRITIN PANEL
Ferritin: 231 ng/mL — ABNORMAL HIGH (ref 15–150)
Iron Saturation: 30 % (ref 15–55)
Iron: 78 ug/dL (ref 27–139)
Total Iron Binding Capacity: 262 ug/dL (ref 250–450)
UIBC: 184 ug/dL (ref 118–369)

## 2023-04-25 DIAGNOSIS — N1832 Chronic kidney disease, stage 3b: Secondary | ICD-10-CM | POA: Diagnosis not present

## 2023-04-29 ENCOUNTER — Encounter: Payer: Self-pay | Admitting: Student

## 2023-05-01 DIAGNOSIS — E213 Hyperparathyroidism, unspecified: Secondary | ICD-10-CM | POA: Diagnosis not present

## 2023-05-01 DIAGNOSIS — E785 Hyperlipidemia, unspecified: Secondary | ICD-10-CM | POA: Diagnosis not present

## 2023-05-01 DIAGNOSIS — N1832 Chronic kidney disease, stage 3b: Secondary | ICD-10-CM | POA: Diagnosis not present

## 2023-05-01 DIAGNOSIS — E875 Hyperkalemia: Secondary | ICD-10-CM | POA: Diagnosis not present

## 2023-05-01 DIAGNOSIS — I129 Hypertensive chronic kidney disease with stage 1 through stage 4 chronic kidney disease, or unspecified chronic kidney disease: Secondary | ICD-10-CM | POA: Diagnosis not present

## 2023-05-10 ENCOUNTER — Other Ambulatory Visit: Payer: Self-pay | Admitting: Family Medicine

## 2023-10-11 ENCOUNTER — Ambulatory Visit (INDEPENDENT_AMBULATORY_CARE_PROVIDER_SITE_OTHER): Admitting: Family Medicine

## 2023-10-11 ENCOUNTER — Ambulatory Visit

## 2023-10-11 VITALS — BP 166/61 | HR 68 | Temp 98.2°F

## 2023-10-11 DIAGNOSIS — I10 Essential (primary) hypertension: Secondary | ICD-10-CM

## 2023-10-11 DIAGNOSIS — B0221 Postherpetic geniculate ganglionitis: Secondary | ICD-10-CM | POA: Diagnosis not present

## 2023-10-11 MED ORDER — ONDANSETRON 4 MG PO TBDP: 4.0000 mg | ORAL_TABLET | Freq: Three times a day (TID) | ORAL | 0 refills | Status: AC | PRN

## 2023-10-11 MED ORDER — PREDNISONE 20 MG PO TABS: 60.0000 mg | ORAL_TABLET | Freq: Every day | ORAL | 0 refills | Status: AC

## 2023-10-11 MED ORDER — VALACYCLOVIR HCL 1 G PO TABS: 1000.0000 mg | ORAL_TABLET | Freq: Three times a day (TID) | ORAL | 0 refills | Status: AC

## 2023-10-11 NOTE — Progress Notes (Signed)
    SUBJECTIVE:   CHIEF COMPLAINT / HPI:   Lip swelling and facial rash  Since Monday has improved since then. Did do a new hair dye on Monday. New hair dye product than normal. Same brand but new product. Patient feels like she developed lip swelling and facial rash the next day (Tuesday). Denies any rash on her scalp. Does not remember the dye falling on her face. Did not eat anything different from her normal diet. Has not used any new products otherwise. Patient felt discomfort at the rash, but not pain. She says they have now crusted over. Denies any lesions in her mouth.   N/V Dizziness  No diarrhea  N/V all last night along with dizziness. Took pepto bismol tablets; however, they did not help. Last vomited this morning. Since this morning patient has been able to drink water without vomiting.  No fevers. Denies sick contacts. Denies abdominal pain.  Patient also endorses hyperacusis, tinnitus, and sensation of the room spinning.   Hypertension  Patient says she is unable to remember what blood pressure medications she takes. She has not taken them for the last two days due to the vomiting. She last saw her nephrologist 6 months ago and has an appointment coming up in September. Has an office encounter from March this year with internal medicine listed for hypertension; though note is not available. Last physical in 2024, patient was on chlorthalidone  25 mg for HTN.    PERTINENT  PMH / PSH: CKD, HTN  OBJECTIVE:   BP (!) 167/58   Pulse 68   Temp 98.2 F (36.8 C) (Oral)   SpO2 100%   General: in no acute distress HEENT: no lesions around the eyes, PERRLA, EOMI, left tragus with scabbed crusted lesion, left ear canal with two small flat red lesions, TM normal  Derm: multiple scabbed and crusted hyperpigmented lesions along the left V3 distribution of the face, no erythema, drainage, bottom lip slightly swollen, no lesions within the mouth. Lesions not painful or pruritic, mucous  membranes slightly dry  CV: well perfused, radial pulses equal and palpable Resp: Normal work of breathing on room air Abd: Soft, non tender, non distended  Neuro: Alert & Oriented x 4, CN2-12 intact, no facial paralysis, no abnormal sensation on the face.        ASSESSMENT/PLAN:   Assessment & Plan Ramsay Hunt auricular syndrome Combination of dermatologic findings along with nausea and dizziness and hyperacusis, tinnitus concerning for shingles, specifically ramsay hunt syndrome. Progression of viral reactivation causing inner ear effects. No facial paralysis at this time or eye involvement.  - Urgent ENT referral: Appt with Dr. Karis on Monday August 18th  - Prednisone  60 mg for 5 days  - Valtrex  1000 mg TID for 10 days  - Zofran  prn for nausea  - Patient given precautions for development of facial paralysis, worsening nausea, or dizziness  - Follow up in one week  Essential hypertension Uncontrolled at this time. However, patient has missed a couple doses due to the vomiting.  - Zofran  for nausea to help tolerate medication  - Follow up in one week and asked patient to bring her medications.      Areta Saliva, MD [facility]

## 2023-10-11 NOTE — Patient Instructions (Addendum)
 It was wonderful to see you today.  Please bring ALL of your medications with you to every visit.   Today we talked about:  Shingles - I believe you have shingles on your face and ear. You may have a specific type called ramsay hunt syndrome which is affecting your inner ear making you nauseous. We are prescribing you a steroid, antiviral medicine, and anti nausea medicine. We have also placed an urgent ENT referral. Please let us  know if you continue to vomit despite this. Also let us  know if you develop any facial paralysis, eye symptoms, or otherwise worsening symptoms.   3pm Monday 8/18 - Dr. Karis [facility] [address] [place], [place] [address] [phone]  You have another follow up with me on 10/18/2023 Friday at 1: 50 pm at the family medicine clinic. Please bring all of your medications.   Thank you for choosing [facility].   Please call [phone] with any questions about today's appointment.  Areta Saliva, MD  Family Medicine

## 2023-10-12 DIAGNOSIS — B0221 Postherpetic geniculate ganglionitis: Secondary | ICD-10-CM | POA: Insufficient documentation

## 2023-10-12 NOTE — Assessment & Plan Note (Signed)
 Combination of dermatologic findings along with nausea and dizziness and hyperacusis, tinnitus concerning for shingles, specifically ramsay hunt syndrome. Progression of viral reactivation causing inner ear effects. No facial paralysis at this time or eye involvement.  - Urgent ENT referral: Appt with Dr. Karis on Monday August 18th  - Prednisone  60 mg for 5 days  - Valtrex  1000 mg TID for 10 days  - Zofran  prn for nausea  - Patient given precautions for development of facial paralysis, worsening nausea, or dizziness  - Follow up in one week

## 2023-10-12 NOTE — Assessment & Plan Note (Signed)
 Uncontrolled at this time. However, patient has missed a couple doses due to the vomiting.  - Zofran  for nausea to help tolerate medication  - Follow up in one week and asked patient to bring her medications.

## 2023-10-14 ENCOUNTER — Encounter (INDEPENDENT_AMBULATORY_CARE_PROVIDER_SITE_OTHER): Payer: Self-pay | Admitting: Otolaryngology

## 2023-10-14 ENCOUNTER — Ambulatory Visit (INDEPENDENT_AMBULATORY_CARE_PROVIDER_SITE_OTHER): Admitting: Otolaryngology

## 2023-10-14 VITALS — BP 125/74 | HR 78 | Temp 98.0°F | Ht 65.75 in | Wt 213.0 lb

## 2023-10-14 DIAGNOSIS — R42 Dizziness and giddiness: Secondary | ICD-10-CM | POA: Diagnosis not present

## 2023-10-14 DIAGNOSIS — B0221 Postherpetic geniculate ganglionitis: Secondary | ICD-10-CM | POA: Diagnosis not present

## 2023-10-14 NOTE — Progress Notes (Unsigned)
 CC: Dizzy, left ear and facial shingles  HPI:  Wanda Vaughn is a 78 y.o. female who presents today for evaluation of her dizziness and left facial and auricular shingles.  According to the patient, her dizziness started abruptly 1 week ago.  She describes her dizziness as an off-balance and lightheaded sensation.  At that time, she also noted increasing left ear and facial pain, with vesicles erupting around her left ear canal and her left midface.  She was diagnosed with Ramsay Hunt syndrome, and was treated with prednisone  and Valtrex .  Her symptoms have improved after she was started on her medications.  Currently she still has difficulty with her balance.  Her otalgia has mostly resolved.  She denies any change in her hearing.  Past Medical History:  Diagnosis Date   Hyperlipidemia    Hypertension    Stroke [facility]) 2013    No past surgical history on file.  Family History  Problem Relation Age of Onset   Heart disease Mother    Hypertension Mother    Stroke Mother     Social History:  reports that she has never smoked. She has never used smokeless tobacco. She reports that she does not drink alcohol and does not use drugs.  Allergies:  Allergies  Allergen Reactions   Penicillins     Pt does not know what the reaction is     Prior to Admission medications   Medication Sig Start Date End Date Taking? Authorizing Provider  aspirin  EC 81 MG tablet Take 1 tablet (81 mg total) by mouth daily. Swallow whole. 07/13/22   Macario Dorothyann HERO, MD  chlorthalidone  (HYGROTON ) 25 MG tablet TAKE 1 TABLET EVERY DAY 05/13/23   Cleotilde Perkins, DO  Cholecalciferol (VITAMIN D3) 125 MCG (5000 UT) TABS Take by mouth.    [provider]  ferrous sulfate 325 (65 FE) MG EC tablet Take 325 mg by mouth 3 (three) times daily with meals.    [provider]  gabapentin  (NEURONTIN ) 100 MG capsule Take 1 capsule (100 mg total) by mouth at bedtime as needed. Patient not taking: Reported on  11/27/2022 07/19/22   Macario Dorothyann HERO, MD  ondansetron  (ZOFRAN -ODT) 4 MG disintegrating tablet Take 1 tablet (4 mg total) by mouth every 8 (eight) hours as needed for nausea or vomiting. 10/11/23   Nicholas Bar, MD  predniSONE  (DELTASONE ) 20 MG tablet Take 3 tablets (60 mg total) by mouth daily with breakfast. 10/11/23   Nicholas Bar, MD  simvastatin  (ZOCOR ) 20 MG tablet TAKE 1 TABLET DAILY AT 6 PM 05/13/23   Cleotilde Perkins, DO  Sodium Zirconium Cyclosilicate  (LOKELMA  PO) Take by mouth every Monday, Wednesday, and Friday.    [provider]  valACYclovir  (VALTREX ) 1000 MG tablet Take 1 tablet (1,000 mg total) by mouth 3 (three) times daily. 10/11/23   Nicholas Bar, MD  vitamin B-12 (CYANOCOBALAMIN ) 100 MCG tablet Take 100 mcg by mouth daily.    [provider]    There were no vitals taken for this visit. Exam: General: Communicates without difficulty, well nourished, no acute distress. Head: Normocephalic, no evidence injury, no tenderness, facial buttresses intact without stepoff. Face/sinus: No tenderness to palpation and percussion. Facial movement is normal and symmetric.  Desiccated vesicles are noted around the left mid face.  Eyes: PERRL, EOMI. No scleral icterus, conjunctivae clear. Neuro: CN II exam reveals vision grossly intact.  No nystagmus at any point of gaze. Ears: Auricles well formed without lesions.  Desiccated vesicles are  noted around the left ear canal.  Nose: External evaluation reveals normal support and skin without lesions.  Dorsum is intact.  Anterior rhinoscopy reveals congested mucosa over anterior aspect of inferior turbinates and intact septum.  No purulence noted. Oral:  Oral cavity and oropharynx are intact, symmetric, without erythema or edema.  Mucosa is moist without lesions. Neck: Full range of motion without pain.  There is no significant lymphadenopathy.  No masses palpable.  Thyroid  bed within normal limits to palpation.  Parotid glands and  submandibular glands equal bilaterally without mass.  Trachea is midline. Neuro:  CN 2-12 grossly intact. Vestibular: No nystagmus at any point of gaze. Dix Hallpike negative.  Vestibular: There is no nystagmus with pneumatic pressure on either tympanic membrane or Valsalva. The cerebellar examination is unremarkable.    Assessment: 1.  Left facial and auricular shingles, consistent with left Ramsay Hunt syndrome. 2.  Her acute dizziness is likely a result of the Ramsay Hunt syndrome. 3.  Her tympanic membrane's and middle ear spaces are noted to be normal.  Plan: 1.  The physical exam findings are reviewed with the patient. 2.  The pathophysiology of dizziness and vestibular dysfunction are extensively discussed with the patient.  Questions are invited and answered. 3.  The patient should complete her current course of prednisone  and Valtrex . 4.  Based on her persistent balance difficulty, she may benefit from vestibular/physical therapy.  A referral will be arranged soon as possible. 5.  The patient is encouraged to call with any questions or concerns.  Mahkai Fangman W Quetzali Heinle 10/14/2023, 3:10 PM

## 2023-10-18 ENCOUNTER — Encounter: Payer: Self-pay | Admitting: Family Medicine

## 2023-10-18 ENCOUNTER — Encounter (INDEPENDENT_AMBULATORY_CARE_PROVIDER_SITE_OTHER): Payer: Self-pay

## 2023-10-18 ENCOUNTER — Ambulatory Visit: Payer: Self-pay | Admitting: Family Medicine

## 2023-10-18 VITALS — BP 143/83 | HR 108 | Ht 63.0 in | Wt 184.4 lb

## 2023-10-18 DIAGNOSIS — I10 Essential (primary) hypertension: Secondary | ICD-10-CM

## 2023-10-18 DIAGNOSIS — B0221 Postherpetic geniculate ganglionitis: Secondary | ICD-10-CM | POA: Diagnosis not present

## 2023-10-18 DIAGNOSIS — R200 Anesthesia of skin: Secondary | ICD-10-CM

## 2023-10-18 DIAGNOSIS — Z13820 Encounter for screening for osteoporosis: Secondary | ICD-10-CM

## 2023-10-18 DIAGNOSIS — R2689 Other abnormalities of gait and mobility: Secondary | ICD-10-CM

## 2023-10-18 DIAGNOSIS — R202 Paresthesia of skin: Secondary | ICD-10-CM

## 2023-10-18 MED ORDER — SARNA 0.5-0.5 % EX LOTN: 1.0000 | TOPICAL_LOTION | CUTANEOUS | 0 refills | Status: AC | PRN

## 2023-10-18 MED ORDER — ZIKS ARTHRITIS PAIN RELIEF 0.025-1-12 % EX CREA: 1.0000 | TOPICAL_CREAM | Freq: Every evening | CUTANEOUS | 0 refills | Status: AC

## 2023-10-18 NOTE — Progress Notes (Signed)
    SUBJECTIVE:   CHIEF COMPLAINT / HPI:   HTN f/u  Follows with nephrology for chronic kidney disease. She saw them 6 months ago. Has a follow up in September. She had elevated blood pressure at last visit at [facility] about a week ago; however, had missed a couple of doses due to nausea and vomiting at that time (related to below problem). At office visit 4 days ago had normal blood pressure.  Patient has taken her chlorthalidone  today.   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 He recommended to complete steroid and valtrex  course. Recommended vestibular rehab for imbalance. Symptoms have improved greatly from last visit at [facility]. Patient no longer needs zofran . She stopped taking it yesterday.  Denies dizziness at this time.  She still feels like her balance is off. She feels like this has been off for about 10 years. She had a stroke about 15 years ago.     PERTINENT  PMH / PSH: CKD, HTN  OBJECTIVE:   BP (!) 143/83 (BP Location: Left Arm, Patient Position: Standing, Cuff Size: Normal)   Pulse (!) 108   Ht 5' 3 (1.6 m)   Wt 184 lb 6.4 oz (83.6 kg)   SpO2 100%   BMI 32.66 kg/m   General: well appearing, in no acute distress CV: well perfused appearing Resp: Normal work of breathing on room air Abd: Soft, non tender, non distended  Neuro: Alert & Oriented x 4, EOMI, CN2-12 grossly intact  Skin: facial lesions have resolved. No longer present    ASSESSMENT/PLAN:   Assessment & Plan Essential hypertension BP elevated above goal; however, she is at high risk for falls given her balance issues. Shared decision making when discussing risks and benefits (kidney protection) with daughter and patient resulting in the decision to hold off on changing regimen.  - Maintain a blood pressure log at home.  - Continue chlorthalidone  - Follow up with Nephrology in one week. Consider discussing HTN and obtain BMP at that  appointment  Ramsay Hunt auricular syndrome Much improved. Patient's vertigo, nausea, hyperacusis, tinnitus have all improved and resolved. Has finished prednisone .  - Finish valtrex   - Awaiting call from vestibular rehab.  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 Has been a long standing issue for patient. Less likely due to her previous stroke as the balance issues started a couple years after for her. Could be due to decreasing strength and proprioception. Normal sensation throughout and unrelated to recent dizziness with ramsay hunt syndrome.  - Home health PT for conditioning and strengthening.  Screening for osteoporosis Due for DEXA scan given age and risk factors.  - Ordered Bone density scan, patient to call and schedule.      Areta Saliva, MD [facility]

## 2023-10-18 NOTE — Patient Instructions (Addendum)
 It was wonderful to see you today.  Please bring ALL of your medications with you to every visit.   Today we talked about:  Ramsay Hunt - Complete your medication courses. Look out for a phone call to schedule your vestibular rehab.   Off balance feeling - I have put in a referral for home health PT. They should call you to schedule an appointment.   For your high blood pressure, I will leave it alone at this time given your recent dizziness. Please have it rechecked at the nephrologist's office. They will consider adding some medication at that time if it is still elevated. I have also attached a log for you to chart your daily blood pressures.   You are due for a bone density scan to screen for osteoporosis.I have ordered this. You can call to schedule your appointment.  This is to test the strength of your bones. It is like an x-ray. You need to call to schedule [phone]. This is located at [address] [facility] [address]---the same place mammograms are completed.   Thank you for choosing [facility].   Please call [phone] with any questions about today's appointment.  Areta Saliva, MD  Family Medicine    Blood Pressure Record Sheet To take your blood pressure, you will need a blood pressure machine. You can buy a blood pressure machine (blood pressure monitor) at your clinic, drug store, or online. When choosing one, consider: An automatic monitor that has an arm cuff. A cuff that wraps snugly around your upper arm. You should be able to fit only one finger between your arm and the cuff. A device that stores blood pressure reading results. Do not choose a monitor that measures your blood pressure from your wrist or finger. Follow your health care provider's instructions for how to take your blood pressure. To use this form: Take your blood pressure medications every day These measurements should be taken when you have been at rest for at least 10-15 min Take  at least 2 readings with each blood pressure check. This makes sure the results are correct. Wait 1-2 minutes between measurements. Write down the results in the spaces on this form. Keep in mind it should always be recorded systolic over diastolic. Both numbers are important.  Repeat this every day for 2-3 weeks, or as told by your health care provider.  Make a follow-up appointment with your health care provider to discuss the results.  Blood Pressure Log Date Medications taken? (Y/N) Blood Pressure Time of Day

## 2023-10-19 NOTE — Assessment & Plan Note (Signed)
 Most likely multifactorial. Has been a long standing issue for patient. Less likely due to her previous stroke as the balance issues started a couple years after for her. Could be due to decreasing strength and proprioception. Normal sensation throughout and unrelated to recent dizziness with ramsay hunt syndrome.  - Home health PT for conditioning and strengthening.

## 2023-10-19 NOTE — Assessment & Plan Note (Signed)
 BP elevated above goal; however, she is at high risk for falls given her balance issues. Shared decision making when discussing risks and benefits (kidney protection) with daughter and patient resulting in the decision to hold off on changing regimen.  - Maintain a blood pressure log at home.  - Continue chlorthalidone  - Follow up with Nephrology in one week. Consider discussing HTN and obtain BMP at that appointment

## 2023-10-19 NOTE — Assessment & Plan Note (Signed)
 Much improved. Patient's vertigo, nausea, hyperacusis, tinnitus have all improved and resolved. Has finished prednisone .  - Finish valtrex   - Awaiting call from vestibular rehab.

## 2023-10-22 ENCOUNTER — Ambulatory Visit (INDEPENDENT_AMBULATORY_CARE_PROVIDER_SITE_OTHER)

## 2023-10-22 VITALS — Ht 63.0 in | Wt 185.0 lb

## 2023-10-22 DIAGNOSIS — Z0000 Encounter for general adult medical examination without abnormal findings: Secondary | ICD-10-CM

## 2023-10-22 NOTE — Progress Notes (Signed)
 Because this visit was a virtual/telehealth visit,  certain criteria was not obtained, such a blood pressure, CBG if applicable, and timed get up and go. Any medications not marked as taking were not mentioned during the medication reconciliation part of the visit. Any vitals not documented were not able to be obtained due to this being a telehealth visit or patient was unable to self-report a recent blood pressure reading due to a lack of equipment at home via telehealth. Vitals that have been documented are verbally provided by the patient.   Subjective:   Wanda Vaughn is a 78 y.o. who presents for a Medicare Wellness preventive visit.  As a reminder, Annual Wellness Visits don't include a physical exam, and some assessments may be limited, especially if this visit is performed virtually. We may recommend an in-person follow-up visit with your provider if needed.  Visit Complete: Virtual I connected with  Wanda Vaughn on 10/22/23 by a audio enabled telemedicine application and verified that I am speaking with the correct person using two identifiers.  Patient Location: Home  Provider Location: Office/Clinic  I discussed the limitations of evaluation and management by telemedicine. The patient expressed understanding and agreed to proceed.  Vital Signs: Because this visit was a virtual/telehealth visit, some criteria may be missing or patient reported. Any vitals not documented were not able to be obtained and vitals that have been documented are patient reported.  VideoDeclined- This patient declined Librarian, academic. Therefore the visit was completed with audio only.  Persons Participating in Visit: Patient.  AWV Questionnaire: No: Patient Medicare AWV questionnaire was not completed prior to this visit.  Cardiac Risk Factors include: advanced age (>30men, >25 women);dyslipidemia;hypertension;obesity (BMI >30kg/m2);family history of premature  cardiovascular disease     Objective:    Today's Vitals   10/22/23 1615  Weight: 185 lb (83.9 kg)  Height: 5' 3 (1.6 m)  PainSc: 0-No pain   Body mass index is 32.77 kg/m.     10/22/2023    4:18 PM 11/27/2022    9:40 AM 07/13/2022   10:07 AM 03/24/2021    9:50 AM 04/05/2020    8:31 AM 03/02/2020    8:45 AM 11/11/2017    8:27 AM  Advanced Directives  Does Patient Have a Medical Advance Directive? No No No No No No No   Would patient like information on creating a medical advance directive? No - Patient declined No - Patient declined No - Patient declined No - Patient declined No - Patient declined No - Patient declined No - Patient declined      Data saved with a previous flowsheet row definition    Current Medications (verified) Outpatient Encounter Medications as of 10/22/2023  Medication Sig   aspirin  EC 81 MG tablet Take 1 tablet (81 mg total) by mouth daily. Swallow whole.   camphor-menthol (SARNA) lotion Apply 1 Application topically as needed for itching.   Capsaicin-Menthol-Methyl Sal (CAPSAICIN-METHYL SAL-MENTHOL) 0.025-1-12 % CREA Apply 1 Application topically at bedtime.   chlorthalidone  (HYGROTON ) 25 MG tablet TAKE 1 TABLET EVERY DAY   Cholecalciferol (VITAMIN D3) 125 MCG (5000 UT) TABS Take by mouth.   ferrous sulfate 325 (65 FE) MG EC tablet Take 325 mg by mouth 3 (three) times daily with meals.   gabapentin  (NEURONTIN ) 100 MG capsule Take 1 capsule (100 mg total) by mouth at bedtime as needed. (Patient not taking: Reported on 10/14/2023)   ondansetron  (ZOFRAN -ODT) 4 MG disintegrating tablet Take 1 tablet (  4 mg total) by mouth every 8 (eight) hours as needed for nausea or vomiting.   predniSONE  (DELTASONE ) 20 MG tablet Take 3 tablets (60 mg total) by mouth daily with breakfast.   simvastatin  (ZOCOR ) 20 MG tablet TAKE 1 TABLET DAILY AT 6 PM   Sodium Zirconium Cyclosilicate  (LOKELMA  PO) Take by mouth every Monday, Wednesday, and Friday.   valACYclovir  (VALTREX ) 1000 MG  tablet Take 1 tablet (1,000 mg total) by mouth 3 (three) times daily.   vitamin B-12 (CYANOCOBALAMIN ) 100 MCG tablet Take 100 mcg by mouth daily.   No facility-administered encounter medications on file as of 10/22/2023.    Allergies (verified) Penicillins   History: Past Medical History:  Diagnosis Date   Hyperlipidemia    Hypertension    Stroke [facility]) 2013   History reviewed. No pertinent surgical history. Family History  Problem Relation Age of Onset   Heart disease Mother    Hypertension Mother    Stroke Mother    Social History   Socioeconomic History   Marital status: Single    Spouse name: Not on file   Number of children: Not on file   Years of education: Not on file   Highest education level: GED or equivalent  Occupational History   Not on file  Tobacco Use   Smoking status: Never   Smokeless tobacco: Never  Substance and Sexual Activity   Alcohol use: Never   Drug use: Never   Sexual activity: Not Currently  Other Topics Concern   Not on file  Social History Narrative   Not on file   Social Drivers of Health   Financial Resource Strain: Low Risk  (10/17/2023)   Overall Financial Resource Strain (CARDIA)    Difficulty of Paying Living Expenses: Not hard at all  Food Insecurity: No Food Insecurity (10/17/2023)   Hunger Vital Sign    Worried About Running Out of Food in the Last Year: Never true    Ran Out of Food in the Last Year: Never true  Transportation Needs: Unmet Transportation Needs (10/17/2023)   PRAPARE - Transportation    Lack of Transportation (Medical): Yes    Lack of Transportation (Non-Medical): Patient declined  Physical Activity: Insufficiently Active (10/17/2023)   Exercise Vital Sign    Days of Exercise per Week: 1 day    Minutes of Exercise per Session: 10 min  Stress: No Stress Concern Present (10/17/2023)   [employer] of Occupational Health - Occupational Stress Questionnaire    Feeling of Stress: Not at all  Social  Connections: Unknown (10/17/2023)   Social Connection and Isolation Panel    Frequency of Communication with Friends and Family: More than three times a week    Frequency of Social Gatherings with Friends and Family: More than three times a week    Attends Religious Services: Not on file    Active Member of Clubs or Organizations: Yes    Attends Banker Meetings: Not on file    Marital Status: Widowed    Tobacco Counseling Counseling given: Not Answered    Clinical Intake:  Pre-visit preparation completed: Yes  Pain : No/denies pain Pain Score: 0-No pain     BMI - recorded: 32.77 Nutritional Status: BMI > 30  Obese Nutritional Risks: None Diabetes: No  Lab Results  Component Value Date   HGBA1C 5.7 (H) 07/13/2022   HGBA1C 5.1 02/17/2020     How often do you need to have someone help you when you read instructions, pamphlets,  or other written materials from your doctor or pharmacy?: 1 - Never  Interpreter Needed?: No  Information entered by :: Abria Vannostrand N. Nicholos Aloisi, LPN.   Activities of Daily Living     10/22/2023    4:22 PM 11/27/2022    9:35 AM  In your present state of health, do you have any difficulty performing the following activities:  Hearing? 0 0  Vision? 0 0  Difficulty concentrating or making decisions? 0 0  Walking or climbing stairs? 0   Dressing or bathing? 0 0  Doing errands, shopping? 0 0  Preparing Food and eating ? N N  Using the Toilet? N N  In the past six months, have you accidently leaked urine? N N  Do you have problems with loss of bowel control? N N  Managing your Medications? N N  Managing your Finances? N N  Housekeeping or managing your Housekeeping? N N    Patient Care Team: Cleotilde Perkins, DO as PCP - [facility], [employer] as Therapist, music (Optometry)  I have updated your Care Teams any recent Medical Services you may have received from other providers in the past year.      Assessment:   This is a routine wellness examination for [facility].  Hearing/Vision screen Hearing Screening - Comments:: Patient has adequate hearing. No hearing aids needed. Vision Screening - Comments:: Patient has adequate vision with the use of eyeglasses.   Last eye exam completed by: [facility]   Goals Addressed             This Visit's Progress    10/22/23: My goal is to continue to pray to be able to walk without any assistive devices.         Depression Screen     10/22/2023    4:19 PM 10/18/2023    1:41 PM 11/27/2022    9:43 AM 07/13/2022   10:11 AM 03/24/2021    9:49 AM 08/05/2020    8:44 AM 04/05/2020    8:37 AM  PHQ 2/9 Scores  PHQ - 2 Score 0  0  0 0 0  PHQ- 9 Score 0    0 0 0  Exception Documentation  Patient refusal  Patient refusal       Fall Risk     10/22/2023    4:18 PM 10/18/2023    1:40 PM 10/11/2023   10:59 AM 11/27/2022    9:35 AM 07/13/2022   10:08 AM  Fall Risk   Falls in the past year? 0 0 0 0 0  Number falls in past yr: 0 0 0 0 0  Injury with Fall? 0 0 0 0 0  Risk for fall due to : No Fall Risks      Follow up Falls evaluation completed   Falls evaluation completed;Education provided;Falls prevention discussed     MEDICARE RISK AT HOME:  Medicare Risk at Home Any stairs in or around the home?: Yes If so, are there any without handrails?: No Home free of loose throw rugs in walkways, pet beds, electrical cords, etc?: Yes Adequate lighting in your home to reduce risk of falls?: Yes Life alert?: No Use of a cane, walker or w/c?: Yes Grab bars in the bathroom?: No Shower chair or bench in shower?: No Elevated toilet seat or a handicapped toilet?: No  TIMED UP AND GO:  Was the test performed?  No  Cognitive Function: Declined/Normal: No cognitive concerns noted by patient or family. Patient alert,  oriented, able to answer questions appropriately and recall recent events. No signs of memory loss or confusion.    10/22/2023     4:19 PM  MMSE - Mini Mental State Exam  Not completed: Unable to complete        10/22/2023    4:17 PM 11/27/2022    9:43 AM  6CIT Screen  What Year? 0 points 0 points  What month? 0 points 0 points  What time? 0 points 0 points  Count back from 20 0 points 0 points  Months in reverse 0 points 0 points  Repeat phrase 0 points 2 points  Total Score 0 points 2 points    Immunizations Immunization History  Administered Date(s) Administered   Fluad Quad(high Dose 65+) 12/01/2018, 02/17/2020, 03/24/2021   Influenza,inj,Quad PF,6+ Mos 11/11/2017   PFIZER(Purple Top)SARS-COV-2 Vaccination 05/15/2019, 06/09/2019, 02/17/2020   Pfizer(Comirnaty)Fall Seasonal Vaccine 12 years and older 07/13/2022   Pneumococcal Conjugate-13 09/02/2018   Pneumococcal Polysaccharide-23 04/01/2020    Screening Tests Health Maintenance  Topic Date Due   DTaP/Tdap/Td (1 - Tdap) Never done   Zoster Vaccines- Shingrix (1 of 2) Never done   DEXA SCAN  Never done   INFLUENZA VACCINE  09/27/2023   COVID-19 Vaccine (5 - 2024-25 season) 11/03/2023 (Originally 10/28/2022)   Medicare Annual Wellness (AWV)  10/21/2024   Pneumococcal Vaccine: 50+ Years  Completed   Hepatitis C Screening  Completed   HPV VACCINES  Aged Out   Meningococcal B Vaccine  Aged Out    Health Maintenance  Health Maintenance Due  Topic Date Due   DTaP/Tdap/Td (1 - Tdap) Never done   Zoster Vaccines- Shingrix (1 of 2) Never done   DEXA SCAN  Never done   INFLUENZA VACCINE  09/27/2023   Health Maintenance Items Addressed: Yes Patient aware of current care gaps.  Additional Screening:  Vision Screening: Recommended annual ophthalmology exams for early detection of glaucoma and other disorders of the eye. Would you like a referral to an eye doctor? No    Dental Screening: Recommended annual dental exams for proper oral hygiene  Community Resource Referral / Chronic Care Management: CRR required this visit?  No   CCM required  this visit?  No   Plan:    I have personally reviewed and noted the following in the patient's chart:   Medical and social history Use of alcohol, tobacco or illicit drugs  Current medications and supplements including opioid prescriptions. Patient is not currently taking opioid prescriptions. Functional ability and status Nutritional status Physical activity Advanced directives List of other physicians Hospitalizations, surgeries, and ER visits in previous 12 months Vitals Screenings to include cognitive, depression, and falls Referrals and appointments  In addition, I have reviewed and discussed with patient certain preventive protocols, quality metrics, and best practice recommendations. A written personalized care plan for preventive services as well as general preventive health recommendations were provided to patient.   Roz LOISE Fuller, LPN   [id number]   After Visit Summary: (MyChart) Due to this being a telephonic visit, the after visit summary with patients personalized plan was offered to patient via MyChart   Notes: Nothing significant to report at this time.

## 2023-10-22 NOTE — Patient Instructions (Signed)
 Ms. Russi , Thank you for taking time out of your busy schedule to complete your Annual Wellness Visit with me. I enjoyed our conversation and look forward to speaking with you again next year. I, as well as your care team,  appreciate your ongoing commitment to your health goals. Please review the following plan we discussed and let me know if I can assist you in the future. Your Game plan/ To Do List    Referrals: If you haven't heard from the office you've been referred to, please reach out to them at the phone provided.   Follow up Visits: We will see or speak with you next year for your Next Medicare AWV with our clinical staff Have you seen your provider in the last 6 months (3 months if uncontrolled diabetes)? Yes  Clinician Recommendations:  Aim for 30 minutes of exercise or brisk walking, 6-8 glasses of water, and 5 servings of fruits and vegetables each day.       This is a list of the screenings recommended for you:  Health Maintenance  Topic Date Due   DTaP/Tdap/Td vaccine (1 - Tdap) Never done   Zoster (Shingles) Vaccine (1 of 2) Never done   DEXA scan (bone density measurement)  Never done   Flu Shot  09/27/2023   COVID-19 Vaccine (5 - 2024-25 season) 11/03/2023*   Medicare Annual Wellness Visit  10/21/2024   Pneumococcal Vaccine for age over 21  Completed   Hepatitis C Screening  Completed   HPV Vaccine  Aged Out   Meningitis B Vaccine  Aged Out  *Topic was postponed. The date shown is not the original due date.    Advanced directives: (Declined) Advance directive discussed with you today. Even though you declined this today, please call our office should you change your mind, and we can give you the proper paperwork for you to fill out. Advance Care Planning is important because it:  [x]  Makes sure you receive the medical care that is consistent with your values, goals, and preferences  [x]  It provides guidance to your family and loved ones and reduces their  decisional burden about whether or not they are making the right decisions based on your wishes.  Follow the link provided in your after visit summary or read over the paperwork we have mailed to you to help you started getting your Advance Directives in place. If you need assistance in completing these, please reach out to us  so that we can help you!  See attachments for Preventive Care and Fall Prevention Tips.

## 2023-10-25 DIAGNOSIS — N1832 Chronic kidney disease, stage 3b: Secondary | ICD-10-CM | POA: Diagnosis not present

## 2023-10-31 ENCOUNTER — Telehealth: Payer: Self-pay

## 2023-10-31 DIAGNOSIS — N1832 Chronic kidney disease, stage 3b: Secondary | ICD-10-CM | POA: Diagnosis not present

## 2023-10-31 DIAGNOSIS — E875 Hyperkalemia: Secondary | ICD-10-CM | POA: Diagnosis not present

## 2023-10-31 DIAGNOSIS — I129 Hypertensive chronic kidney disease with stage 1 through stage 4 chronic kidney disease, or unspecified chronic kidney disease: Secondary | ICD-10-CM | POA: Diagnosis not present

## 2023-10-31 DIAGNOSIS — E213 Hyperparathyroidism, unspecified: Secondary | ICD-10-CM | POA: Diagnosis not present

## 2023-10-31 DIAGNOSIS — D509 Iron deficiency anemia, unspecified: Secondary | ICD-10-CM | POA: Diagnosis not present

## 2023-10-31 DIAGNOSIS — E785 Hyperlipidemia, unspecified: Secondary | ICD-10-CM | POA: Diagnosis not present

## 2023-10-31 NOTE — Telephone Encounter (Signed)
 Patient calls nurse line requesting an update on referral for Home Health PT.   Advised that referral was still pending, however, I would reach out to referral coordinator for update.   Chiquita JAYSON English, RN

## 2023-11-02 DIAGNOSIS — H9319 Tinnitus, unspecified ear: Secondary | ICD-10-CM | POA: Diagnosis not present

## 2023-11-02 DIAGNOSIS — R21 Rash and other nonspecific skin eruption: Secondary | ICD-10-CM | POA: Diagnosis not present

## 2023-11-02 DIAGNOSIS — I129 Hypertensive chronic kidney disease with stage 1 through stage 4 chronic kidney disease, or unspecified chronic kidney disease: Secondary | ICD-10-CM | POA: Diagnosis not present

## 2023-11-02 DIAGNOSIS — H61892 Other specified disorders of left external ear: Secondary | ICD-10-CM | POA: Diagnosis not present

## 2023-11-02 DIAGNOSIS — D509 Iron deficiency anemia, unspecified: Secondary | ICD-10-CM | POA: Diagnosis not present

## 2023-11-02 DIAGNOSIS — H93239 Hyperacusis, unspecified ear: Secondary | ICD-10-CM | POA: Diagnosis not present

## 2023-11-02 DIAGNOSIS — B0221 Postherpetic geniculate ganglionitis: Secondary | ICD-10-CM | POA: Diagnosis not present

## 2023-11-02 DIAGNOSIS — E7800 Pure hypercholesterolemia, unspecified: Secondary | ICD-10-CM | POA: Diagnosis not present

## 2023-11-02 DIAGNOSIS — N189 Chronic kidney disease, unspecified: Secondary | ICD-10-CM | POA: Diagnosis not present

## 2023-11-04 DIAGNOSIS — H93239 Hyperacusis, unspecified ear: Secondary | ICD-10-CM | POA: Diagnosis not present

## 2023-11-05 ENCOUNTER — Telehealth: Payer: Self-pay

## 2023-11-05 NOTE — Telephone Encounter (Signed)
 [name] with [facility] calls nurse line requesting verbal orders for Wanda Vaughn, Wanda Vaughn as follows.   2x a week for 1 week  1x a week for 4 weeks  1x a week every other week   Total of 8 Vaughn visits.   Verbal order given.

## 2023-11-11 DIAGNOSIS — E7800 Pure hypercholesterolemia, unspecified: Secondary | ICD-10-CM | POA: Diagnosis not present

## 2023-11-11 DIAGNOSIS — N189 Chronic kidney disease, unspecified: Secondary | ICD-10-CM | POA: Diagnosis not present

## 2023-11-11 DIAGNOSIS — H93239 Hyperacusis, unspecified ear: Secondary | ICD-10-CM | POA: Diagnosis not present

## 2023-11-11 DIAGNOSIS — H9319 Tinnitus, unspecified ear: Secondary | ICD-10-CM | POA: Diagnosis not present

## 2023-11-11 DIAGNOSIS — I129 Hypertensive chronic kidney disease with stage 1 through stage 4 chronic kidney disease, or unspecified chronic kidney disease: Secondary | ICD-10-CM | POA: Diagnosis not present

## 2023-11-11 DIAGNOSIS — D509 Iron deficiency anemia, unspecified: Secondary | ICD-10-CM | POA: Diagnosis not present

## 2023-11-11 DIAGNOSIS — R21 Rash and other nonspecific skin eruption: Secondary | ICD-10-CM | POA: Diagnosis not present

## 2023-11-11 DIAGNOSIS — B0221 Postherpetic geniculate ganglionitis: Secondary | ICD-10-CM | POA: Diagnosis not present

## 2023-11-11 DIAGNOSIS — H61892 Other specified disorders of left external ear: Secondary | ICD-10-CM | POA: Diagnosis not present

## 2023-11-15 DIAGNOSIS — B0221 Postherpetic geniculate ganglionitis: Secondary | ICD-10-CM | POA: Diagnosis not present

## 2023-11-15 DIAGNOSIS — I129 Hypertensive chronic kidney disease with stage 1 through stage 4 chronic kidney disease, or unspecified chronic kidney disease: Secondary | ICD-10-CM | POA: Diagnosis not present

## 2023-11-15 DIAGNOSIS — N189 Chronic kidney disease, unspecified: Secondary | ICD-10-CM | POA: Diagnosis not present

## 2023-11-15 DIAGNOSIS — R21 Rash and other nonspecific skin eruption: Secondary | ICD-10-CM | POA: Diagnosis not present

## 2023-11-15 DIAGNOSIS — E7800 Pure hypercholesterolemia, unspecified: Secondary | ICD-10-CM | POA: Diagnosis not present

## 2023-11-15 DIAGNOSIS — D509 Iron deficiency anemia, unspecified: Secondary | ICD-10-CM | POA: Diagnosis not present

## 2023-11-15 DIAGNOSIS — H93239 Hyperacusis, unspecified ear: Secondary | ICD-10-CM | POA: Diagnosis not present

## 2023-11-15 DIAGNOSIS — H9319 Tinnitus, unspecified ear: Secondary | ICD-10-CM | POA: Diagnosis not present

## 2023-11-15 DIAGNOSIS — H61892 Other specified disorders of left external ear: Secondary | ICD-10-CM | POA: Diagnosis not present

## 2023-11-18 DIAGNOSIS — H93239 Hyperacusis, unspecified ear: Secondary | ICD-10-CM | POA: Diagnosis not present

## 2023-11-20 DIAGNOSIS — D509 Iron deficiency anemia, unspecified: Secondary | ICD-10-CM | POA: Diagnosis not present

## 2023-11-20 DIAGNOSIS — B0221 Postherpetic geniculate ganglionitis: Secondary | ICD-10-CM | POA: Diagnosis not present

## 2023-11-20 DIAGNOSIS — H93239 Hyperacusis, unspecified ear: Secondary | ICD-10-CM | POA: Diagnosis not present

## 2023-11-20 DIAGNOSIS — E7800 Pure hypercholesterolemia, unspecified: Secondary | ICD-10-CM | POA: Diagnosis not present

## 2023-11-20 DIAGNOSIS — N189 Chronic kidney disease, unspecified: Secondary | ICD-10-CM | POA: Diagnosis not present

## 2023-11-20 DIAGNOSIS — H61892 Other specified disorders of left external ear: Secondary | ICD-10-CM | POA: Diagnosis not present

## 2023-11-20 DIAGNOSIS — H9319 Tinnitus, unspecified ear: Secondary | ICD-10-CM | POA: Diagnosis not present

## 2023-11-20 DIAGNOSIS — I129 Hypertensive chronic kidney disease with stage 1 through stage 4 chronic kidney disease, or unspecified chronic kidney disease: Secondary | ICD-10-CM | POA: Diagnosis not present

## 2023-11-20 DIAGNOSIS — R21 Rash and other nonspecific skin eruption: Secondary | ICD-10-CM | POA: Diagnosis not present

## 2023-11-27 DIAGNOSIS — D509 Iron deficiency anemia, unspecified: Secondary | ICD-10-CM | POA: Diagnosis not present

## 2023-11-27 DIAGNOSIS — N189 Chronic kidney disease, unspecified: Secondary | ICD-10-CM | POA: Diagnosis not present

## 2023-11-27 DIAGNOSIS — H9319 Tinnitus, unspecified ear: Secondary | ICD-10-CM | POA: Diagnosis not present

## 2023-11-27 DIAGNOSIS — H93239 Hyperacusis, unspecified ear: Secondary | ICD-10-CM | POA: Diagnosis not present

## 2023-11-27 DIAGNOSIS — E7800 Pure hypercholesterolemia, unspecified: Secondary | ICD-10-CM | POA: Diagnosis not present

## 2023-11-27 DIAGNOSIS — B0221 Postherpetic geniculate ganglionitis: Secondary | ICD-10-CM | POA: Diagnosis not present

## 2023-11-27 DIAGNOSIS — H61892 Other specified disorders of left external ear: Secondary | ICD-10-CM | POA: Diagnosis not present

## 2023-11-27 DIAGNOSIS — I129 Hypertensive chronic kidney disease with stage 1 through stage 4 chronic kidney disease, or unspecified chronic kidney disease: Secondary | ICD-10-CM | POA: Diagnosis not present

## 2023-12-02 DIAGNOSIS — D509 Iron deficiency anemia, unspecified: Secondary | ICD-10-CM | POA: Diagnosis not present

## 2023-12-02 DIAGNOSIS — H9319 Tinnitus, unspecified ear: Secondary | ICD-10-CM | POA: Diagnosis not present

## 2023-12-02 DIAGNOSIS — R21 Rash and other nonspecific skin eruption: Secondary | ICD-10-CM | POA: Diagnosis not present

## 2023-12-02 DIAGNOSIS — N189 Chronic kidney disease, unspecified: Secondary | ICD-10-CM | POA: Diagnosis not present

## 2023-12-02 DIAGNOSIS — E7800 Pure hypercholesterolemia, unspecified: Secondary | ICD-10-CM | POA: Diagnosis not present

## 2023-12-02 DIAGNOSIS — H93239 Hyperacusis, unspecified ear: Secondary | ICD-10-CM | POA: Diagnosis not present

## 2023-12-02 DIAGNOSIS — H61892 Other specified disorders of left external ear: Secondary | ICD-10-CM | POA: Diagnosis not present

## 2023-12-02 DIAGNOSIS — B0221 Postherpetic geniculate ganglionitis: Secondary | ICD-10-CM | POA: Diagnosis not present

## 2023-12-16 DIAGNOSIS — H5203 Hypermetropia, bilateral: Secondary | ICD-10-CM | POA: Diagnosis not present

## 2023-12-16 DIAGNOSIS — H25813 Combined forms of age-related cataract, bilateral: Secondary | ICD-10-CM | POA: Diagnosis not present

## 2023-12-16 DIAGNOSIS — H52209 Unspecified astigmatism, unspecified eye: Secondary | ICD-10-CM | POA: Diagnosis not present

## 2023-12-16 DIAGNOSIS — H524 Presbyopia: Secondary | ICD-10-CM | POA: Diagnosis not present

## 2023-12-25 ENCOUNTER — Institutional Professional Consult (permissible substitution) (INDEPENDENT_AMBULATORY_CARE_PROVIDER_SITE_OTHER): Admitting: Otolaryngology

## 2023-12-27 DIAGNOSIS — H61892 Other specified disorders of left external ear: Secondary | ICD-10-CM | POA: Diagnosis not present

## 2023-12-27 DIAGNOSIS — B0221 Postherpetic geniculate ganglionitis: Secondary | ICD-10-CM | POA: Diagnosis not present

## 2023-12-27 DIAGNOSIS — I129 Hypertensive chronic kidney disease with stage 1 through stage 4 chronic kidney disease, or unspecified chronic kidney disease: Secondary | ICD-10-CM | POA: Diagnosis not present

## 2023-12-27 DIAGNOSIS — E7800 Pure hypercholesterolemia, unspecified: Secondary | ICD-10-CM | POA: Diagnosis not present

## 2023-12-27 DIAGNOSIS — H9319 Tinnitus, unspecified ear: Secondary | ICD-10-CM | POA: Diagnosis not present

## 2023-12-27 DIAGNOSIS — N189 Chronic kidney disease, unspecified: Secondary | ICD-10-CM | POA: Diagnosis not present

## 2023-12-27 DIAGNOSIS — D509 Iron deficiency anemia, unspecified: Secondary | ICD-10-CM | POA: Diagnosis not present

## 2023-12-27 DIAGNOSIS — R21 Rash and other nonspecific skin eruption: Secondary | ICD-10-CM | POA: Diagnosis not present

## 2023-12-27 DIAGNOSIS — H93239 Hyperacusis, unspecified ear: Secondary | ICD-10-CM | POA: Diagnosis not present

## 2024-02-29 ENCOUNTER — Other Ambulatory Visit: Payer: Self-pay | Admitting: Student

## 2024-03-24 ENCOUNTER — Other Ambulatory Visit: Payer: Self-pay | Admitting: Student
